# Patient Record
Sex: Female | Born: 1965 | Race: Black or African American | Hispanic: No | Marital: Married | Smoking: Never smoker
Health system: Southern US, Community
[De-identification: ages and names within clinical notes are randomized; demographics above are authoritative.]

## PROBLEM LIST (undated history)

## (undated) DIAGNOSIS — R519 Headache, unspecified: Secondary | ICD-10-CM

## (undated) DIAGNOSIS — M1990 Unspecified osteoarthritis, unspecified site: Secondary | ICD-10-CM

## (undated) DIAGNOSIS — R7303 Prediabetes: Secondary | ICD-10-CM

## (undated) DIAGNOSIS — F419 Anxiety disorder, unspecified: Secondary | ICD-10-CM

## (undated) DIAGNOSIS — J189 Pneumonia, unspecified organism: Secondary | ICD-10-CM

## (undated) DIAGNOSIS — E669 Obesity, unspecified: Secondary | ICD-10-CM

## (undated) DIAGNOSIS — E039 Hypothyroidism, unspecified: Secondary | ICD-10-CM

## (undated) DIAGNOSIS — G4730 Sleep apnea, unspecified: Secondary | ICD-10-CM

## (undated) DIAGNOSIS — K219 Gastro-esophageal reflux disease without esophagitis: Secondary | ICD-10-CM

## (undated) HISTORY — PX: COSMETIC SURGERY: SHX468

## (undated) HISTORY — PX: COLONOSCOPY: SHX174

## (undated) HISTORY — PX: BUNIONECTOMY: SHX129

## (undated) HISTORY — PX: BARIATRIC SURGERY: SHX1103

## (undated) HISTORY — DX: Obesity, unspecified: E66.9

---

## 2003-11-29 HISTORY — PX: BREAST SURGERY: SHX581

## 2007-11-29 DIAGNOSIS — C801 Malignant (primary) neoplasm, unspecified: Secondary | ICD-10-CM

## 2007-11-29 HISTORY — PX: TOTAL THYROIDECTOMY: SHX2547

## 2007-11-29 HISTORY — DX: Malignant (primary) neoplasm, unspecified: C80.1

## 2014-11-28 DIAGNOSIS — J45909 Unspecified asthma, uncomplicated: Secondary | ICD-10-CM

## 2014-11-28 HISTORY — DX: Unspecified asthma, uncomplicated: J45.909

## 2019-12-02 ENCOUNTER — Emergency Department (HOSPITAL_COMMUNITY)
Admission: EM | Admit: 2019-12-02 | Discharge: 2019-12-02 | Discharge status: Absent without leave | Payer: Federal, State, Local not specified - PPO

## 2019-12-03 DIAGNOSIS — Z20828 Contact with and (suspected) exposure to other viral communicable diseases: Secondary | ICD-10-CM | POA: Diagnosis not present

## 2019-12-16 DIAGNOSIS — Z20822 Contact with and (suspected) exposure to covid-19: Secondary | ICD-10-CM | POA: Diagnosis not present

## 2019-12-16 DIAGNOSIS — Z20828 Contact with and (suspected) exposure to other viral communicable diseases: Secondary | ICD-10-CM | POA: Diagnosis not present

## 2019-12-19 DIAGNOSIS — Z6841 Body Mass Index (BMI) 40.0 and over, adult: Secondary | ICD-10-CM | POA: Diagnosis not present

## 2019-12-19 DIAGNOSIS — E890 Postprocedural hypothyroidism: Secondary | ICD-10-CM | POA: Diagnosis not present

## 2019-12-19 DIAGNOSIS — M1990 Unspecified osteoarthritis, unspecified site: Secondary | ICD-10-CM | POA: Diagnosis not present

## 2019-12-19 DIAGNOSIS — Z85850 Personal history of malignant neoplasm of thyroid: Secondary | ICD-10-CM | POA: Diagnosis not present

## 2019-12-20 DIAGNOSIS — E890 Postprocedural hypothyroidism: Secondary | ICD-10-CM | POA: Diagnosis not present

## 2019-12-20 DIAGNOSIS — E781 Pure hyperglyceridemia: Secondary | ICD-10-CM | POA: Diagnosis not present

## 2019-12-20 DIAGNOSIS — Z0000 Encounter for general adult medical examination without abnormal findings: Secondary | ICD-10-CM | POA: Diagnosis not present

## 2019-12-25 DIAGNOSIS — R7309 Other abnormal glucose: Secondary | ICD-10-CM | POA: Diagnosis not present

## 2019-12-25 DIAGNOSIS — Z0000 Encounter for general adult medical examination without abnormal findings: Secondary | ICD-10-CM | POA: Diagnosis not present

## 2019-12-25 DIAGNOSIS — E781 Pure hyperglyceridemia: Secondary | ICD-10-CM | POA: Diagnosis not present

## 2019-12-31 DIAGNOSIS — R7301 Impaired fasting glucose: Secondary | ICD-10-CM | POA: Diagnosis not present

## 2019-12-31 DIAGNOSIS — Z85850 Personal history of malignant neoplasm of thyroid: Secondary | ICD-10-CM | POA: Diagnosis not present

## 2019-12-31 DIAGNOSIS — E781 Pure hyperglyceridemia: Secondary | ICD-10-CM | POA: Diagnosis not present

## 2019-12-31 DIAGNOSIS — E890 Postprocedural hypothyroidism: Secondary | ICD-10-CM | POA: Diagnosis not present

## 2020-01-04 DIAGNOSIS — Z1231 Encounter for screening mammogram for malignant neoplasm of breast: Secondary | ICD-10-CM | POA: Diagnosis not present

## 2020-02-05 DIAGNOSIS — E781 Pure hyperglyceridemia: Secondary | ICD-10-CM | POA: Diagnosis not present

## 2020-02-11 DIAGNOSIS — Z0000 Encounter for general adult medical examination without abnormal findings: Secondary | ICD-10-CM | POA: Diagnosis not present

## 2020-02-11 DIAGNOSIS — M1611 Unilateral primary osteoarthritis, right hip: Secondary | ICD-10-CM | POA: Diagnosis not present

## 2020-02-11 DIAGNOSIS — M25551 Pain in right hip: Secondary | ICD-10-CM | POA: Diagnosis not present

## 2020-02-11 DIAGNOSIS — E781 Pure hyperglyceridemia: Secondary | ICD-10-CM | POA: Diagnosis not present

## 2020-02-13 DIAGNOSIS — R7309 Other abnormal glucose: Secondary | ICD-10-CM | POA: Diagnosis not present

## 2020-02-13 DIAGNOSIS — R7301 Impaired fasting glucose: Secondary | ICD-10-CM | POA: Diagnosis not present

## 2020-03-20 DIAGNOSIS — M25552 Pain in left hip: Secondary | ICD-10-CM | POA: Diagnosis not present

## 2020-03-20 DIAGNOSIS — M1611 Unilateral primary osteoarthritis, right hip: Secondary | ICD-10-CM | POA: Diagnosis not present

## 2020-03-26 DIAGNOSIS — Z01818 Encounter for other preprocedural examination: Secondary | ICD-10-CM | POA: Diagnosis not present

## 2020-03-26 DIAGNOSIS — E890 Postprocedural hypothyroidism: Secondary | ICD-10-CM | POA: Diagnosis not present

## 2020-04-08 DIAGNOSIS — E890 Postprocedural hypothyroidism: Secondary | ICD-10-CM | POA: Diagnosis not present

## 2020-04-09 NOTE — Patient Instructions (Addendum)
DUE TO COVID-19 ONLY ONE VISITOR IS ALLOWED TO COME WITH YOU AND STAY IN THE WAITING ROOM ONLY DURING PRE OP AND PROCEDURE DAY OF SURGERY. THE 2 VISITORS  MAY VISIT WITH YOU AFTER SURGERY IN YOUR PRIVATE ROOM DURING VISITING HOURS ONLY!  YOU NEED TO HAVE A COVID 19 TEST ON_5/22______ @_10 :05______, THIS TEST MUST BE DONE BEFORE SURGERY, COME  [address], [place] , [address].  ([place]) ONCE YOUR COVID TEST IS COMPLETED, PLEASE BEGIN THE QUARANTINE INSTRUCTIONS AS OUTLINED IN YOUR HANDOUT.                Erika Lambert    Your procedure is scheduled on: 04/22/20   Report to [facility] Main  Entrance   Report to admitting at  8:20 AM     Call this number if you have problems the morning of surgery [place], NO CHEWING GUM [place].    Do not eat food After Midnight.   YOU MAY HAVE CLEAR LIQUIDS FROM MIDNIGHT UNTIL 7:30 AM.   At 7:30 AM Please finish the prescribed Pre-Surgery Gatorade drink  . Nothing by mouth after you finish the Gatorade drink !   Take these medicines the morning of surgery with A SIP OF WATER: Levothyroxine                                 You may not have any metal on your body including hair pins and              piercings  Do not wear jewelry, make-up, lotions, powders or perfumes, deodorant             Do not wear nail polish on your fingernails.  Do not shave  48 hours prior to surgery.        .   Do not bring valuables to the hospital. [place].  Contacts, dentures or bridgework may not be worn into surgery.       Patients discharged the day of surgery will not be allowed to drive home .  IF YOU ARE HAVING SURGERY AND GOING HOME THE SAME DAY, YOU MUST HAVE AN ADULT TO DRIVE YOU HOME AND BE WITH YOU FOR 24 HOURS.   YOU MAY GO HOME BY TAXI OR UBER OR ORTHERWISE, BUT AN ADULT MUST ACCOMPANY  YOU HOME AND STAY WITH YOU FOR 24 HOURS.  Name and phone number of your driver:  Special Instructions: N/A              Please read over the following fact sheets you were given: _____________________________________________________________________             [facility] - Preparing for Surgery  Before surgery, you can play an important role.   Because skin is not sterile, your skin needs to be as free of germs as possible .  You can reduce the number of germs on your skin by washing with CHG (chlorahexidine gluconate) soap before surgery.   CHG is an antiseptic cleaner which kills germs and bonds with the skin to continue killing germs even after washing. Please DO NOT use if you have an allergy to CHG or antibacterial soaps.   If  your skin becomes reddened/irritated stop using the CHG and inform your nurse when you arrive at Short Stay. Do not shave (including legs and underarms) for at least 48 hours prior to the first CHG shower.  Please follow these instructions carefully:  1.  Shower with CHG Soap the night before surgery and the  morning of Surgery.  2.  If you choose to wash your hair, wash your hair first as usual with your  normal  shampoo.  3.  After you shampoo, rinse your hair and body thoroughly to remove the  shampoo.                                        4.  Use CHG as you would any other liquid soap.  You can apply chg directly  to the skin and wash                       Gently with a scrungie or clean washcloth.  5.  Apply the CHG Soap to your body ONLY FROM THE NECK DOWN.   Do not use on face/ open                           Wound or open sores. Avoid contact with eyes, ears mouth and genitals (private parts).                       Wash face,  Genitals (private parts) with your normal soap.             6.  Wash thoroughly, paying special attention to the area where your surgery  will be performed.  7.  Thoroughly rinse your body with warm water from the neck  down.  8.  DO NOT shower/wash with your normal soap after using and rinsing off  the CHG Soap.             9.  Pat yourself dry with a clean towel.            10.  Wear clean pajamas.            11.  Place clean sheets on your bed the night of your first shower and do not  sleep with pets. Day of Surgery : Do not apply any lotions/deodorants the morning of surgery.  Please wear clean clothes to the hospital/surgery center.  FAILURE TO FOLLOW THESE INSTRUCTIONS MAY RESULT IN THE CANCELLATION OF YOUR SURGERY PATIENT SIGNATURE_________________________________  NURSE SIGNATURE__________________________________  ________________________________________________________________________   Erika Lambert  An incentive spirometer is a tool that can help keep your lungs clear and active. This tool measures how well you are filling your lungs with each breath. Taking long deep breaths may help reverse or decrease the chance of developing breathing (pulmonary) problems (especially infection) following:  A long period of time when you are unable to move or be active. BEFORE THE PROCEDURE   If the spirometer includes an indicator to show your best effort, your nurse or respiratory therapist will set it to a desired goal.  If possible, sit up straight or lean slightly forward. Try not to slouch.  Hold the incentive spirometer in an upright position. INSTRUCTIONS FOR USE  1. Sit on the edge of your bed if possible, or sit up as far as you can in  bed or on a chair. 2. Hold the incentive spirometer in an upright position. 3. Breathe out normally. 4. Place the mouthpiece in your mouth and seal your lips tightly around it. 5. Breathe in slowly and as deeply as possible, raising the piston or the ball toward the top of the column. 6. Hold your breath for 3-5 seconds or for as long as possible. Allow the piston or ball to fall to the bottom of the column. 7. Remove the mouthpiece from your mouth  and breathe out normally. 8. Rest for a few seconds and repeat Steps 1 through 7 at least 10 times every 1-2 hours when you are awake. Take your time and take a few normal breaths between deep breaths. 9. The spirometer may include an indicator to show your best effort. Use the indicator as a goal to work toward during each repetition. 10. After each set of 10 deep breaths, practice coughing to be sure your lungs are clear. If you have an incision (the cut made at the time of surgery), support your incision when coughing by placing a pillow or rolled up towels firmly against it. Once you are able to get out of bed, walk around indoors and cough well. You may stop using the incentive spirometer when instructed by your caregiver.  RISKS AND COMPLICATIONS  Take your time so you do not get dizzy or light-headed.  If you are in pain, you may need to take or ask for pain medication before doing incentive spirometry. It is harder to take a deep breath if you are having pain. AFTER USE  Rest and breathe slowly and easily.  It can be helpful to keep track of a log of your progress. Your caregiver can provide you with a simple table to help with this. If you are using the spirometer at home, follow these instructions: [place] IF:   You are having difficultly using the spirometer.  You have trouble using the spirometer as often as instructed.  Your pain medication is not giving enough relief while using the spirometer.  You develop fever of 100.5 F (38.1 C) or higher. SEEK IMMEDIATE MEDICAL CARE IF:   You cough up bloody sputum that had not been present before.  You develop fever of 102 F (38.9 C) or greater.  You develop worsening pain at or near the incision site. MAKE SURE YOU:   Understand these instructions.  Will watch your condition.  Will get help right away if you are not doing well or get worse. Document Released: 03/27/2007 Document Revised: 02/06/2012 Document  Reviewed: 05/28/2007 [facility] Patient Information 2014 [place], [place].   ________________________________________________________________________

## 2020-04-10 ENCOUNTER — Encounter (HOSPITAL_COMMUNITY): Payer: Self-pay | Admitting: *Deleted

## 2020-04-10 ENCOUNTER — Other Ambulatory Visit: Payer: Self-pay

## 2020-04-10 ENCOUNTER — Encounter (HOSPITAL_COMMUNITY)
Admission: RE | Admit: 2020-04-10 | Discharge: 2020-04-10 | Disposition: A | Discharge status: Home / Self Care | Payer: Federal, State, Local not specified - PPO | Source: Ambulatory Visit | Attending: Orthopedic Surgery | Admitting: Orthopedic Surgery

## 2020-04-10 DIAGNOSIS — Z01812 Encounter for preprocedural laboratory examination: Secondary | ICD-10-CM | POA: Insufficient documentation

## 2020-04-10 HISTORY — DX: Prediabetes: R73.03

## 2020-04-10 NOTE — Progress Notes (Signed)
PCP - Dr. Quinn Axe Cardiologist - no  Chest x-ray - no EKG - no Stress Test - no ECHO - no Cardiac Cath - no  Sleep Study - yes CPAP - no. Pt had wt loss and apnea resolved  Fasting Blood Sugar - NA Checks Blood Sugar _____ times a day  Blood Thinner Instructions: Aspirin Instructions:NA Last Dose:  Anesthesia review:   Patient denies shortness of breath, fever, cough and chest pain at PAT appointment yes  Patient verbalized understanding of instructions that were given to them at the PAT appointment. Patient was also instructed that they will need to review over the PAT instructions again at home before surgery. yes

## 2020-04-14 ENCOUNTER — Other Ambulatory Visit: Payer: Self-pay

## 2020-04-14 ENCOUNTER — Encounter (HOSPITAL_COMMUNITY)
Admission: RE | Admit: 2020-04-14 | Discharge: 2020-04-14 | Disposition: A | Discharge status: Home / Self Care | Payer: Federal, State, Local not specified - PPO | Source: Ambulatory Visit | Attending: Orthopedic Surgery | Admitting: Orthopedic Surgery

## 2020-04-14 DIAGNOSIS — Z79899 Other long term (current) drug therapy: Secondary | ICD-10-CM | POA: Diagnosis not present

## 2020-04-14 DIAGNOSIS — Z01812 Encounter for preprocedural laboratory examination: Secondary | ICD-10-CM | POA: Insufficient documentation

## 2020-04-14 DIAGNOSIS — M1711 Unilateral primary osteoarthritis, right knee: Secondary | ICD-10-CM | POA: Insufficient documentation

## 2020-04-14 DIAGNOSIS — Z85850 Personal history of malignant neoplasm of thyroid: Secondary | ICD-10-CM | POA: Insufficient documentation

## 2020-04-14 DIAGNOSIS — Z79890 Hormone replacement therapy (postmenopausal): Secondary | ICD-10-CM | POA: Diagnosis not present

## 2020-04-14 LAB — COMPREHENSIVE METABOLIC PANEL
ALT: 12 U/L (ref 0–44)
AST: 22 U/L (ref 15–41)
Albumin: 4.1 g/dL (ref 3.5–5.0)
Alkaline Phosphatase: 69 U/L (ref 38–126)
Anion gap: 6 (ref 5–15)
BUN: 14 mg/dL (ref 6–20)
CO2: 28 mmol/L (ref 22–32)
Calcium: 9.1 mg/dL (ref 8.9–10.3)
Chloride: 104 mmol/L (ref 98–111)
Creatinine, Ser: 0.68 mg/dL (ref 0.44–1.00)
GFR calc Af Amer: >60 mL/min (ref >60)
GFR calc non Af Amer: >60 mL/min (ref >60)
Glucose, Bld: 98 mg/dL (ref 70–99)
Potassium: 4.3 mmol/L (ref 3.5–5.1)
Sodium: 138 mmol/L (ref 135–145)
Total Bilirubin: 0.6 mg/dL (ref 0.3–1.2)
Total Protein: 7.1 g/dL (ref 6.5–8.1)

## 2020-04-14 LAB — PROTIME-INR
INR: 1.2 (ref 0.8–1.2)
Prothrombin Time: 14.7 seconds (ref 11.4–15.2)

## 2020-04-14 LAB — CBC
HCT: 43 % (ref 36.0–46.0)
Hemoglobin: 13.8 g/dL (ref 12.0–15.0)
MCH: 30.9 pg (ref 26.0–34.0)
MCHC: 32.1 g/dL (ref 30.0–36.0)
MCV: 96.2 fL (ref 80.0–100.0)
Platelets: 188 10*3/uL (ref 150–400)
RBC: 4.47 MIL/uL (ref 3.87–5.11)
RDW: 13.7 % (ref 11.5–15.5)
WBC: 6.2 10*3/uL (ref 4.0–10.5)
nRBC: 0 % (ref 0.0–0.2)

## 2020-04-14 LAB — APTT: aPTT: 28 seconds (ref 24–36)

## 2020-04-15 LAB — HEMOGLOBIN A1C
Hgb A1c MFr Bld: 5.8 % — ABNORMAL HIGH (ref 4.8–5.6)
Mean Plasma Glucose: 120 mg/dL

## 2020-04-15 LAB — ABO/RH: ABO/RH(D): B POS

## 2020-04-15 NOTE — H&P (Signed)
TOTAL HIP ADMISSION H&P  Patient is admitted for right total hip arthroplasty.  Subjective:  Chief Complaint: Right hip pain  HPI: Erika Lambert, 54 y.o. female, has a history of pain and functional disability in the right hip due to arthritis and patient has failed non-surgical conservative treatments for greater than 12 weeks to include NSAID's and/or analgesics and activity modification. Onset of symptoms was gradual, starting several years ago with gradually worsening course since that time. The patient noted no past surgery on the right hip. Patient currently rates pain in the right hip at 8 out of 10 with activity. Patient has worsening of pain with activity and weight bearing, pain that interfers with activities of daily living, crepitus and instability. Patient has evidence of end-stage osteoarthritic change in the right hip, severe bone-on-bone with osteophyte formation. 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 This condition presents safety issues increasing the risk of falls. There is no current active infection.  There are no problems to display for this patient.   Past Medical History:  Diagnosis Date  . Asthma 2016  . Cancer [facility]) 2009   thyroid  . Pre-diabetes     Past Surgical History:  Procedure Laterality Date  . BARIATRIC SURGERY    . BREAST SURGERY Bilateral 2005   lift and skin removed  . BUNIONECTOMY Left   . CESAREAN SECTION  1990  . TOTAL THYROIDECTOMY  2009    Prior to Admission medications   Medication Sig Start Date End Date Taking? Authorizing Provider  acetaminophen (TYLENOL) 500 MG tablet Take 500-1,000 mg by mouth every 6 (six) hours as needed (pain/headaches.).   Yes [provider]  Biotin w/ Vitamins C & E (HAIR/SKIN/NAILS PO) Take 2 tablets by mouth daily with lunch.   Yes [provider]  levothyroxine (SYNTHROID) 125 MCG tablet Take 125 mcg by mouth daily before breakfast. 02/17/20  Yes  [provider]  Multiple Vitamin (MULTIVITAMIN WITH MINERALS) TABS tablet Take 1 tablet by mouth daily at 6 PM. Centrum Silver Women's 50+   Yes [provider]    Allergies  Allergen Reactions  . Albuterol Hives  . Latex Other (See Comments)    welts  . Penicillins Hives and Other (See Comments)    dizziness    Social History   Socioeconomic History  . Marital status: Married    Spouse name: Not on file  . Number of children: Not on file  . Years of education: Not on file  . Highest education level: Not on file  Occupational History  . Not on file  Tobacco Use  . Smoking status: Never Smoker  . Smokeless tobacco: Never Used  Substance and Sexual Activity  . Alcohol use: Never  . Drug use: Never  . Sexual activity: Not on file  Other Topics Concern  . Not on file  Social History Narrative  . Not on file   Social Determinants of Health   Financial Resource Strain:   . Difficulty of Paying Living Expenses:   Food Insecurity:   . Worried About Charity fundraiser in the Last Year:   . Arboriculturist in the Last Year:   Transportation Needs:   . Film/video editor (Medical):   Marland Kitchen Lack of Transportation (Non-Medical):   Physical Activity:   . Days of Exercise per Week:   . Minutes of Exercise per Session:   Stress:   . Feeling of Stress :  Social Connections:   . Frequency of Communication with Friends and Family:   . Frequency of Social Gatherings with Friends and Family:   . Attends Religious Services:   . Active Member of Clubs or Organizations:   . Attends Archivist Meetings:   Marland Kitchen Marital Status:   Intimate Partner Violence:   . Fear of Current or Ex-Partner:   . Emotionally Abused:   Marland Kitchen Physically Abused:   . Sexually Abused:       Tobacco Use: Low Risk   . Smoking Tobacco Use: Never Smoker  . Smokeless Tobacco Use: Never Used   Social History   Substance and Sexual Activity  Alcohol Use Never    No family  history on file.  Review of Systems  Constitutional: Negative for chills and fever.  HENT: Negative for congestion, sore throat and tinnitus.   Eyes: Negative for double vision, photophobia and pain.  Respiratory: Negative for cough, shortness of breath and wheezing.   Cardiovascular: Negative for chest pain, palpitations and orthopnea.  Gastrointestinal: Negative for heartburn, nausea and vomiting.  Genitourinary: Negative for dysuria, frequency and urgency.  Musculoskeletal: Positive for joint pain.  Neurological: Negative for dizziness, weakness and headaches.     Objective:  Physical Exam: Well nourished and well developed.  General: Alert and oriented x3, cooperative and pleasant, no acute distress.  Head: normocephalic, atraumatic, neck supple.  Eyes: EOMI.  Respiratory: breath sounds clear in all fields, no wheezing, rales, or rhonchi. Cardiovascular: Regular rate and rhythm, no murmurs, gallops or rubs.  Abdomen: non-tender to palpation and soft, normoactive bowel sounds. Musculoskeletal:  Right Hip Exam: The range of motion: Flexion to 90 degrees. No internal rotation. External rotation to 10 to 20 degrees. Abduction to 20 degrees. There is no tenderness over the greater trochanteric bursa.   Calves soft and nontender. Motor function intact in LE. Strength 5/5 LE bilaterally. Neuro: Distal pulses 2+. Sensation to light touch intact in LE.  Vital signs in last 24 hours: Temp:  [98.6 F (37 C)] 98.6 F (37 C) (05/18 1634) Pulse Rate:  [63] 63 (05/18 1634) Resp:  [18] 18 (05/18 1634) BP: (149)/(89) 149/89 (05/18 1634) SpO2:  [99 %] 99 % (05/18 1634) Weight:  [110.8 kg] 110.8 kg (05/18 1634)  Imaging Review Plain radiographs demonstrate severe degenerative joint disease of the right hip. The bone quality appears to be adequate for age and reported activity level.  Assessment/Plan:  End stage arthritis, right hip  The patient history, physical examination,  clinical judgement of the provider and imaging studies are consistent with end stage degenerative joint disease of the right hip and total hip arthroplasty is deemed medically necessary. The treatment options including medical management, injection therapy, arthroscopy and arthroplasty were discussed at length. The risks and benefits of total hip arthroplasty were presented and reviewed. The risks due to aseptic loosening, infection, stiffness, dislocation/subluxation, thromboembolic complications and other imponderables were discussed. The patient acknowledged the explanation, agreed to proceed with the plan and consent was signed. Patient is being admitted for inpatient treatment for surgery, pain control, PT, OT, prophylactic antibiotics, VTE prophylaxis, progressive ambulation and ADLs and discharge planning.The patient is planning to be discharged home.   Patient's anticipated LOS is less than 2 midnights, meeting these requirements: - Younger than 49 - Lives within 1 hour of care - Has a competent adult at home to recover with post-op recover - NO history of  - Chronic pain requiring opiods  - Diabetes  - Coronary  Artery Disease  - Heart failure  - Heart attack  - Stroke  - DVT/VTE  - Cardiac arrhythmia  - Respiratory Failure/COPD  - Renal failure  - Anemia  - Advanced Liver disease  Therapy Plans: HEP Disposition: Home with mom Planned DVT Prophylaxis: Xarelto 10 mg QD (hx of thyroid CA > 10 years ago) DME Needed: Gilford Rile, 3-in-1 PCP: Sela Hilding, MD (appt on 05/12) TXA: IV Allergies: Latex (swelling), PCN (rash, dizziness) Anesthesia Concerns: None BMI: 39.7  Other:  - SDD - Cannot take NSAIDs due to gastric irritation  - Patient was instructed on what medications to stop prior to surgery. - Follow-up visit in 2 weeks with Dr. Wynelle Link - Begin physical therapy following surgery - Pre-operative lab work as pre-surgical testing - Prescriptions will be provided in  hospital at time of discharge  Theresa Duty, PA-C Orthopedic Surgery [facility] [place]

## 2020-04-18 ENCOUNTER — Other Ambulatory Visit (HOSPITAL_COMMUNITY)
Admission: RE | Admit: 2020-04-18 | Discharge: 2020-04-18 | Disposition: A | Discharge status: Home / Self Care | Payer: Federal, State, Local not specified - PPO | Source: Ambulatory Visit | Attending: Orthopedic Surgery | Admitting: Orthopedic Surgery

## 2020-04-18 DIAGNOSIS — Z01812 Encounter for preprocedural laboratory examination: Secondary | ICD-10-CM | POA: Insufficient documentation

## 2020-04-18 DIAGNOSIS — Z20822 Contact with and (suspected) exposure to covid-19: Secondary | ICD-10-CM | POA: Diagnosis not present

## 2020-04-18 LAB — SARS CORONAVIRUS 2 (TAT 6-24 HRS): SARS Coronavirus 2: NEGATIVE

## 2020-04-21 NOTE — Progress Notes (Signed)
Pt informed of new time to arrive at the hospital.

## 2020-04-21 NOTE — Anesthesia Preprocedure Evaluation (Addendum)
Anesthesia Evaluation  Patient identified by MRN, date of birth, ID band Patient awake    Reviewed: Allergy & Precautions, NPO status , Patient's Chart, lab work & pertinent test results  Airway Mallampati: II  TM Distance: >3 FB Neck ROM: Full    Dental  (+) Teeth Intact, Dental Advisory Given, Chipped,    Pulmonary asthma ,    Pulmonary exam normal breath sounds clear to auscultation       Cardiovascular negative cardio ROS Normal cardiovascular exam Rhythm:Regular Rate:Normal     Neuro/Psych negative neurological ROS     GI/Hepatic negative GI ROS, Neg liver ROS,   Endo/Other  Hypothyroidism Morbid obesity  Renal/GU negative Renal ROS     Musculoskeletal  (+) Arthritis ,   Abdominal   Peds  Hematology negative hematology ROS (+) Plt 188k   Anesthesia Other Findings   Reproductive/Obstetrics                            Anesthesia Physical Anesthesia Plan  ASA: III  Anesthesia Plan: Spinal   Post-op Pain Management:    Induction: Intravenous  PONV Risk Score and Plan: 2 and Propofol infusion and Treatment may vary due to age or medical condition  Airway Management Planned: Natural Airway and Nasal Cannula  Additional Equipment:   Intra-op Plan:   Post-operative Plan:   Informed Consent: I have reviewed the patients History and Physical, chart, labs and discussed the procedure including the risks, benefits and alternatives for the proposed anesthesia with the patient or authorized representative who has indicated his/her understanding and acceptance.     Dental advisory given  Plan Discussed with: CRNA  Anesthesia Plan Comments:        Anesthesia Quick Evaluation

## 2020-04-22 ENCOUNTER — Ambulatory Visit (HOSPITAL_COMMUNITY): Payer: Federal, State, Local not specified - PPO | Admitting: Anesthesiology

## 2020-04-22 ENCOUNTER — Other Ambulatory Visit: Payer: Self-pay

## 2020-04-22 ENCOUNTER — Ambulatory Visit (HOSPITAL_COMMUNITY): Payer: Federal, State, Local not specified - PPO

## 2020-04-22 ENCOUNTER — Observation Stay (HOSPITAL_COMMUNITY)
Admission: RE | Admit: 2020-04-22 | Discharge: 2020-04-23 | Disposition: A | Discharge status: Home / Self Care | Payer: Federal, State, Local not specified - PPO | Attending: Orthopedic Surgery | Admitting: Orthopedic Surgery

## 2020-04-22 ENCOUNTER — Encounter (HOSPITAL_COMMUNITY): Payer: Self-pay | Admitting: Orthopedic Surgery

## 2020-04-22 ENCOUNTER — Encounter (HOSPITAL_COMMUNITY)
Admission: RE | Disposition: A | Discharge status: Home / Self Care | Payer: Self-pay | Source: Home / Self Care | Attending: Orthopedic Surgery

## 2020-04-22 ENCOUNTER — Telehealth (HOSPITAL_COMMUNITY): Payer: Self-pay | Admitting: *Deleted

## 2020-04-22 DIAGNOSIS — Z9884 Bariatric surgery status: Secondary | ICD-10-CM | POA: Diagnosis not present

## 2020-04-22 DIAGNOSIS — M1611 Unilateral primary osteoarthritis, right hip: Secondary | ICD-10-CM | POA: Diagnosis not present

## 2020-04-22 DIAGNOSIS — Z85850 Personal history of malignant neoplasm of thyroid: Secondary | ICD-10-CM | POA: Diagnosis not present

## 2020-04-22 DIAGNOSIS — M169 Osteoarthritis of hip, unspecified: Secondary | ICD-10-CM | POA: Diagnosis present

## 2020-04-22 DIAGNOSIS — J45909 Unspecified asthma, uncomplicated: Secondary | ICD-10-CM | POA: Insufficient documentation

## 2020-04-22 DIAGNOSIS — R7303 Prediabetes: Secondary | ICD-10-CM | POA: Diagnosis not present

## 2020-04-22 DIAGNOSIS — Z96649 Presence of unspecified artificial hip joint: Secondary | ICD-10-CM | POA: Diagnosis not present

## 2020-04-22 DIAGNOSIS — Z96641 Presence of right artificial hip joint: Secondary | ICD-10-CM

## 2020-04-22 DIAGNOSIS — Z6841 Body Mass Index (BMI) 40.0 and over, adult: Secondary | ICD-10-CM | POA: Diagnosis not present

## 2020-04-22 DIAGNOSIS — E039 Hypothyroidism, unspecified: Secondary | ICD-10-CM | POA: Diagnosis not present

## 2020-04-22 DIAGNOSIS — Z7902 Long term (current) use of antithrombotics/antiplatelets: Secondary | ICD-10-CM | POA: Insufficient documentation

## 2020-04-22 DIAGNOSIS — Z79890 Hormone replacement therapy (postmenopausal): Secondary | ICD-10-CM | POA: Diagnosis not present

## 2020-04-22 DIAGNOSIS — Z79899 Other long term (current) drug therapy: Secondary | ICD-10-CM | POA: Insufficient documentation

## 2020-04-22 DIAGNOSIS — Z419 Encounter for procedure for purposes other than remedying health state, unspecified: Secondary | ICD-10-CM

## 2020-04-22 DIAGNOSIS — Z471 Aftercare following joint replacement surgery: Secondary | ICD-10-CM | POA: Diagnosis not present

## 2020-04-22 HISTORY — PX: TOTAL HIP ARTHROPLASTY: SHX124

## 2020-04-22 LAB — TYPE AND SCREEN
ABO/RH(D): B POS
Antibody Screen: NEGATIVE

## 2020-04-22 SURGERY — ARTHROPLASTY, HIP, TOTAL, ANTERIOR APPROACH
Anesthesia: Spinal | Site: Hip | Laterality: Right

## 2020-04-22 MED ORDER — METHOCARBAMOL 500 MG PO TABS
500.0000 mg | ORAL_TABLET | Freq: Four times a day (QID) | ORAL | Status: DC | PRN
  Administered 2020-04-23 (×2): 500 mg via ORAL
  Filled 2020-04-22 (×2): qty 1

## 2020-04-22 MED ORDER — EPHEDRINE 5 MG/ML INJ
INTRAVENOUS | Status: AC
  Filled 2020-04-22: qty 10

## 2020-04-22 MED ORDER — ACETAMINOPHEN 325 MG PO TABS: 325.0000 mg | ORAL_TABLET | Freq: Four times a day (QID) | ORAL | Status: DC | PRN

## 2020-04-22 MED ORDER — MEPIVACAINE HCL (PF) 2 % IJ SOLN
INTRAMUSCULAR | Status: DC | PRN
Start: 2020-04-22 — End: 2020-04-22
  Administered 2020-04-22: 3 mL via INTRATHECAL

## 2020-04-22 MED ORDER — METHOCARBAMOL 500 MG IVPB - SIMPLE MED
INTRAVENOUS | Status: AC
  Filled 2020-04-22: qty 50

## 2020-04-22 MED ORDER — EPHEDRINE SULFATE-NACL 50-0.9 MG/10ML-% IV SOSY
PREFILLED_SYRINGE | INTRAVENOUS | Status: DC | PRN
  Administered 2020-04-22: 10 mg via INTRAVENOUS
  Administered 2020-04-22 (×3): 5 mg via INTRAVENOUS
  Administered 2020-04-22: 10 mg via INTRAVENOUS

## 2020-04-22 MED ORDER — LACTATED RINGERS IV BOLUS
250.0000 mL | Freq: Once | INTRAVENOUS | Status: AC
  Administered 2020-04-22: 250 mL via INTRAVENOUS

## 2020-04-22 MED ORDER — DEXAMETHASONE SODIUM PHOSPHATE 10 MG/ML IJ SOLN
8.0000 mg | Freq: Once | INTRAMUSCULAR | Status: AC
  Administered 2020-04-22: 8 mg via INTRAVENOUS

## 2020-04-22 MED ORDER — FENTANYL CITRATE (PF) 100 MCG/2ML IJ SOLN
INTRAMUSCULAR | Status: DC | PRN
  Administered 2020-04-22: 50 ug via INTRAVENOUS
  Administered 2020-04-22 (×2): 25 ug via INTRAVENOUS

## 2020-04-22 MED ORDER — DOCUSATE SODIUM 100 MG PO CAPS
100.0000 mg | ORAL_CAPSULE | Freq: Two times a day (BID) | ORAL | Status: DC
  Administered 2020-04-22 – 2020-04-23 (×2): 100 mg via ORAL
  Filled 2020-04-22 (×2): qty 1

## 2020-04-22 MED ORDER — BUPIVACAINE HCL 0.25 % IJ SOLN
INTRAMUSCULAR | Status: AC
  Filled 2020-04-22: qty 1

## 2020-04-22 MED ORDER — PROPOFOL 500 MG/50ML IV EMUL
INTRAVENOUS | Status: DC | PRN
  Administered 2020-04-22: 75 ug/kg/min via INTRAVENOUS

## 2020-04-22 MED ORDER — MORPHINE SULFATE (PF) 4 MG/ML IV SOLN: 0.5000 mg | INTRAVENOUS | Status: DC | PRN

## 2020-04-22 MED ORDER — ACETAMINOPHEN 10 MG/ML IV SOLN
1000.0000 mg | Freq: Once | INTRAVENOUS | Status: AC
  Administered 2020-04-22: 1000 mg via INTRAVENOUS
  Filled 2020-04-22: qty 100

## 2020-04-22 MED ORDER — TRAMADOL HCL 50 MG PO TABS: 50.0000 mg | ORAL_TABLET | Freq: Four times a day (QID) | ORAL | Status: DC | PRN

## 2020-04-22 MED ORDER — PROPOFOL 1000 MG/100ML IV EMUL
INTRAVENOUS | Status: AC
  Filled 2020-04-22: qty 100

## 2020-04-22 MED ORDER — LIDOCAINE 2% (20 MG/ML) 5 ML SYRINGE
INTRAMUSCULAR | Status: DC | PRN
  Administered 2020-04-22: 40 mg via INTRAVENOUS

## 2020-04-22 MED ORDER — MAGNESIUM CITRATE PO SOLN: 1.0000 | Freq: Once | ORAL | Status: DC | PRN

## 2020-04-22 MED ORDER — FENTANYL CITRATE (PF) 100 MCG/2ML IJ SOLN
INTRAMUSCULAR | Status: AC
  Administered 2020-04-22: 50 ug via INTRAVENOUS
  Filled 2020-04-22: qty 2

## 2020-04-22 MED ORDER — PHENYLEPHRINE HCL (PRESSORS) 10 MG/ML IV SOLN
INTRAVENOUS | Status: AC
  Filled 2020-04-22: qty 1

## 2020-04-22 MED ORDER — CHLORHEXIDINE GLUCONATE 0.12 % MT SOLN
15.0000 mL | Freq: Once | OROMUCOSAL | Status: AC
  Administered 2020-04-22: 15 mL via OROMUCOSAL

## 2020-04-22 MED ORDER — PROPOFOL 10 MG/ML IV BOLUS
INTRAVENOUS | Status: DC | PRN
  Administered 2020-04-22 (×2): 10 mg via INTRAVENOUS
  Administered 2020-04-22: 20 mg via INTRAVENOUS

## 2020-04-22 MED ORDER — ONDANSETRON HCL 4 MG/2ML IJ SOLN
INTRAMUSCULAR | Status: DC | PRN
  Administered 2020-04-22: 4 mg via INTRAVENOUS

## 2020-04-22 MED ORDER — RIVAROXABAN 10 MG PO TABS
10.0000 mg | ORAL_TABLET | Freq: Every day | ORAL | Status: DC
  Administered 2020-04-23: 10 mg via ORAL
  Filled 2020-04-22: qty 1

## 2020-04-22 MED ORDER — PROMETHAZINE HCL 25 MG/ML IJ SOLN: 6.2500 mg | INTRAMUSCULAR | Status: DC | PRN

## 2020-04-22 MED ORDER — OXYCODONE-ACETAMINOPHEN 5-325 MG PO TABS
ORAL_TABLET | ORAL | Status: AC
  Filled 2020-04-22: qty 1

## 2020-04-22 MED ORDER — 0.9 % SODIUM CHLORIDE (POUR BTL) OPTIME
TOPICAL | Status: DC | PRN
  Administered 2020-04-22: 1000 mL

## 2020-04-22 MED ORDER — HYDROCODONE-ACETAMINOPHEN 5-325 MG PO TABS
1.0000 | ORAL_TABLET | ORAL | Status: DC | PRN
  Administered 2020-04-22 (×2): 2 via ORAL
  Administered 2020-04-22: 1 via ORAL
  Administered 2020-04-23 (×3): 2 via ORAL
  Filled 2020-04-22 (×5): qty 2

## 2020-04-22 MED ORDER — ONDANSETRON HCL 4 MG/2ML IJ SOLN: 4.0000 mg | Freq: Four times a day (QID) | INTRAMUSCULAR | Status: DC | PRN

## 2020-04-22 MED ORDER — PHENYLEPHRINE HCL-NACL 10-0.9 MG/250ML-% IV SOLN
INTRAVENOUS | Status: DC | PRN
Start: 2020-04-22 — End: 2020-04-22
  Administered 2020-04-22: 20 ug/min via INTRAVENOUS

## 2020-04-22 MED ORDER — DEXAMETHASONE SODIUM PHOSPHATE 10 MG/ML IJ SOLN
INTRAMUSCULAR | Status: AC
  Filled 2020-04-22: qty 1

## 2020-04-22 MED ORDER — POVIDONE-IODINE 10 % EX SWAB
2.0000 "application " | Freq: Once | CUTANEOUS | Status: AC
  Administered 2020-04-22: 2 via TOPICAL

## 2020-04-22 MED ORDER — LACTATED RINGERS IV BOLUS: 250.0000 mL | Freq: Once | INTRAVENOUS | Status: DC

## 2020-04-22 MED ORDER — MORPHINE SULFATE (PF) 4 MG/ML IV SOLN
INTRAVENOUS | Status: AC
  Administered 2020-04-22: 1 mg via INTRAVENOUS
  Filled 2020-04-22: qty 1

## 2020-04-22 MED ORDER — FENTANYL CITRATE (PF) 100 MCG/2ML IJ SOLN
INTRAMUSCULAR | Status: AC
  Filled 2020-04-22: qty 2

## 2020-04-22 MED ORDER — HYDROCODONE-ACETAMINOPHEN 5-325 MG PO TABS
ORAL_TABLET | ORAL | Status: AC
  Administered 2020-04-22: 1 via ORAL
  Filled 2020-04-22: qty 1

## 2020-04-22 MED ORDER — HYDROCODONE-ACETAMINOPHEN 5-325 MG PO TABS: 1.0000 | ORAL_TABLET | Freq: Four times a day (QID) | ORAL | 0 refills | Status: DC | PRN

## 2020-04-22 MED ORDER — ONDANSETRON HCL 4 MG/2ML IJ SOLN
INTRAMUSCULAR | Status: AC
  Filled 2020-04-22: qty 2

## 2020-04-22 MED ORDER — DEXAMETHASONE SODIUM PHOSPHATE 10 MG/ML IJ SOLN
10.0000 mg | Freq: Once | INTRAMUSCULAR | Status: AC
  Administered 2020-04-23: 10 mg via INTRAVENOUS
  Filled 2020-04-22: qty 1

## 2020-04-22 MED ORDER — PROPOFOL 10 MG/ML IV BOLUS
INTRAVENOUS | Status: AC
  Filled 2020-04-22: qty 20

## 2020-04-22 MED ORDER — TRANEXAMIC ACID-NACL 1000-0.7 MG/100ML-% IV SOLN
1000.0000 mg | INTRAVENOUS | Status: AC
  Administered 2020-04-22: 1000 mg via INTRAVENOUS
  Filled 2020-04-22: qty 100

## 2020-04-22 MED ORDER — KETOROLAC TROMETHAMINE 30 MG/ML IJ SOLN
INTRAMUSCULAR | Status: AC
  Administered 2020-04-22: 30 mg
  Filled 2020-04-22: qty 1

## 2020-04-22 MED ORDER — METHOCARBAMOL 500 MG PO TABS: 500.0000 mg | ORAL_TABLET | Freq: Four times a day (QID) | ORAL | 0 refills | Status: DC | PRN

## 2020-04-22 MED ORDER — BUPIVACAINE HCL 0.25 % IJ SOLN
INTRAMUSCULAR | Status: DC | PRN
  Administered 2020-04-22: 30 mL

## 2020-04-22 MED ORDER — PHENOL 1.4 % MT LIQD: 1.0000 | OROMUCOSAL | Status: DC | PRN

## 2020-04-22 MED ORDER — LACTATED RINGERS IV SOLN: INTRAVENOUS | Status: DC

## 2020-04-22 MED ORDER — KETOROLAC TROMETHAMINE 30 MG/ML IJ SOLN: 30.0000 mg | Freq: Once | INTRAMUSCULAR | Status: DC

## 2020-04-22 MED ORDER — POLYETHYLENE GLYCOL 3350 17 G PO PACK: 17.0000 g | PACK | Freq: Every day | ORAL | Status: DC | PRN

## 2020-04-22 MED ORDER — FENTANYL CITRATE (PF) 100 MCG/2ML IJ SOLN
25.0000 ug | INTRAMUSCULAR | Status: DC | PRN
  Administered 2020-04-22: 50 ug via INTRAVENOUS

## 2020-04-22 MED ORDER — BUPIVACAINE-EPINEPHRINE (PF) 0.25% -1:200000 IJ SOLN
INTRAMUSCULAR | Status: AC
  Filled 2020-04-22: qty 30

## 2020-04-22 MED ORDER — METHOCARBAMOL 500 MG IVPB - SIMPLE MED
500.0000 mg | Freq: Four times a day (QID) | INTRAVENOUS | Status: DC | PRN
  Administered 2020-04-22: 500 mg via INTRAVENOUS
  Filled 2020-04-22: qty 50

## 2020-04-22 MED ORDER — CEFAZOLIN SODIUM-DEXTROSE 2-4 GM/100ML-% IV SOLN
2.0000 g | INTRAVENOUS | Status: AC
  Administered 2020-04-22: 2 g via INTRAVENOUS
  Filled 2020-04-22: qty 100

## 2020-04-22 MED ORDER — MENTHOL 3 MG MT LOZG: 1.0000 | LOZENGE | OROMUCOSAL | Status: DC | PRN

## 2020-04-22 MED ORDER — MIDAZOLAM HCL 2 MG/2ML IJ SOLN
INTRAMUSCULAR | Status: AC
  Filled 2020-04-22: qty 2

## 2020-04-22 MED ORDER — LIDOCAINE 2% (20 MG/ML) 5 ML SYRINGE
INTRAMUSCULAR | Status: AC
  Filled 2020-04-22: qty 5

## 2020-04-22 MED ORDER — CEFAZOLIN SODIUM-DEXTROSE 2-4 GM/100ML-% IV SOLN
2.0000 g | Freq: Four times a day (QID) | INTRAVENOUS | Status: AC
  Administered 2020-04-22 (×2): 2 g via INTRAVENOUS
  Filled 2020-04-22: qty 100

## 2020-04-22 MED ORDER — METOCLOPRAMIDE HCL 5 MG PO TABS: 5.0000 mg | ORAL_TABLET | Freq: Three times a day (TID) | ORAL | Status: DC | PRN

## 2020-04-22 MED ORDER — METOCLOPRAMIDE HCL 5 MG/ML IJ SOLN: 5.0000 mg | Freq: Three times a day (TID) | INTRAMUSCULAR | Status: DC | PRN

## 2020-04-22 MED ORDER — TRAMADOL HCL 50 MG PO TABS: 50.0000 mg | ORAL_TABLET | Freq: Four times a day (QID) | ORAL | 0 refills | Status: DC | PRN

## 2020-04-22 MED ORDER — LEVOTHYROXINE SODIUM 125 MCG PO TABS
125.0000 ug | ORAL_TABLET | Freq: Every day | ORAL | Status: DC
  Administered 2020-04-23: 125 ug via ORAL
  Filled 2020-04-22: qty 1

## 2020-04-22 MED ORDER — LACTATED RINGERS IV BOLUS
500.0000 mL | Freq: Once | INTRAVENOUS | Status: AC
  Administered 2020-04-22: 500 mL via INTRAVENOUS

## 2020-04-22 MED ORDER — CEFAZOLIN SODIUM-DEXTROSE 2-4 GM/100ML-% IV SOLN
INTRAVENOUS | Status: AC
  Filled 2020-04-22: qty 100

## 2020-04-22 MED ORDER — WATER FOR IRRIGATION, STERILE IR SOLN
Status: DC | PRN
  Administered 2020-04-22: 2000 mL

## 2020-04-22 MED ORDER — BISACODYL 10 MG RE SUPP: 10.0000 mg | Freq: Every day | RECTAL | Status: DC | PRN

## 2020-04-22 MED ORDER — ONDANSETRON HCL 4 MG PO TABS: 4.0000 mg | ORAL_TABLET | Freq: Four times a day (QID) | ORAL | Status: DC | PRN

## 2020-04-22 MED ORDER — MIDAZOLAM HCL 2 MG/2ML IJ SOLN
INTRAMUSCULAR | Status: DC | PRN
  Administered 2020-04-22: 2 mg via INTRAVENOUS

## 2020-04-22 MED ORDER — SODIUM CHLORIDE 0.9 % IV SOLN: INTRAVENOUS | Status: DC

## 2020-04-22 MED ORDER — HYDROCODONE-ACETAMINOPHEN 5-325 MG PO TABS
ORAL_TABLET | ORAL | Status: AC
  Filled 2020-04-22: qty 1

## 2020-04-22 MED ORDER — RIVAROXABAN 10 MG PO TABS
10.0000 mg | ORAL_TABLET | Freq: Every day | ORAL | 0 refills | Status: DC
Start: 2020-04-22 — End: 2021-01-05

## 2020-04-22 SURGICAL SUPPLY — 46 items
BAG DECANTER FOR FLEXI CONT (MISCELLANEOUS) IMPLANT
BAG ZIPLOCK 12X15 (MISCELLANEOUS) IMPLANT
BLADE SAG 18X100X1.27 (BLADE) ×3 IMPLANT
CLOSURE WOUND 1/2 X4 (GAUZE/BANDAGES/DRESSINGS) ×2
COVER PERINEAL POST (MISCELLANEOUS) ×3 IMPLANT
COVER SURGICAL LIGHT HANDLE (MISCELLANEOUS) ×3 IMPLANT
COVER WAND RF STERILE (DRAPES) IMPLANT
CUP ACET PINNACLE SECTR 50MM (Hips) ×1 IMPLANT
DECANTER SPIKE VIAL GLASS SM (MISCELLANEOUS) ×3 IMPLANT
DRAPE STERI IOBAN 125X83 (DRAPES) ×3 IMPLANT
DRAPE U-SHAPE 47X51 STRL (DRAPES) ×6 IMPLANT
DRSG ADAPTIC 3X8 NADH LF (GAUZE/BANDAGES/DRESSINGS) ×3 IMPLANT
DRSG AQUACEL AG ADV 3.5X10 (GAUZE/BANDAGES/DRESSINGS) ×3 IMPLANT
DURAPREP 26ML APPLICATOR (WOUND CARE) ×3 IMPLANT
ELECT REM PT RETURN 15FT ADLT (MISCELLANEOUS) ×3 IMPLANT
EVACUATOR 1/8 PVC DRAIN (DRAIN) IMPLANT
GLOVE BIO SURGEON STRL SZ 6 (GLOVE) IMPLANT
GLOVE BIO SURGEON STRL SZ7 (GLOVE) IMPLANT
GLOVE BIO SURGEON STRL SZ8 (GLOVE) ×3 IMPLANT
GLOVE BIOGEL PI IND STRL 6.5 (GLOVE) IMPLANT
GLOVE BIOGEL PI IND STRL 7.0 (GLOVE) IMPLANT
GLOVE BIOGEL PI IND STRL 8 (GLOVE) ×1 IMPLANT
GLOVE BIOGEL PI INDICATOR 6.5 (GLOVE)
GLOVE BIOGEL PI INDICATOR 7.0 (GLOVE)
GLOVE BIOGEL PI INDICATOR 8 (GLOVE) ×2
GOWN STRL REUS W/TWL LRG LVL3 (GOWN DISPOSABLE) ×3 IMPLANT
GOWN STRL REUS W/TWL XL LVL3 (GOWN DISPOSABLE) IMPLANT
HEAD FEMORAL 32 CERAMIC (Hips) ×3 IMPLANT
HOLDER FOLEY CATH W/STRAP (MISCELLANEOUS) ×3 IMPLANT
KIT TURNOVER KIT A (KITS) IMPLANT
LINER MARATHON 32 50 (Hips) ×3 IMPLANT
MANIFOLD NEPTUNE II (INSTRUMENTS) ×3 IMPLANT
PACK ANTERIOR HIP CUSTOM (KITS) ×3 IMPLANT
PENCIL SMOKE EVACUATOR COATED (MISCELLANEOUS) ×3 IMPLANT
PINNACLE SECTOR CUP 50MM (Hips) ×3 IMPLANT
STEM FEMORAL SZ 6MM STD ACTIS (Stem) ×3 IMPLANT
STRIP CLOSURE SKIN 1/2X4 (GAUZE/BANDAGES/DRESSINGS) ×4 IMPLANT
SUT ETHIBOND NAB CT1 #1 30IN (SUTURE) ×3 IMPLANT
SUT MNCRL AB 4-0 PS2 18 (SUTURE) ×3 IMPLANT
SUT STRATAFIX 0 PDS 27 VIOLET (SUTURE) ×3
SUT VIC AB 2-0 CT1 27 (SUTURE) ×4
SUT VIC AB 2-0 CT1 TAPERPNT 27 (SUTURE) ×2 IMPLANT
SUTURE STRATFX 0 PDS 27 VIOLET (SUTURE) ×1 IMPLANT
SYR 50ML LL SCALE MARK (SYRINGE) IMPLANT
TRAY FOLEY MTR SLVR 16FR STAT (SET/KITS/TRAYS/PACK) ×3 IMPLANT
YANKAUER SUCT BULB TIP 10FT TU (MISCELLANEOUS) ×3 IMPLANT

## 2020-04-22 NOTE — Anesthesia Postprocedure Evaluation (Signed)
Anesthesia Post Note  Patient: Erika Lambert  Procedure(s) Performed: TOTAL HIP ARTHROPLASTY ANTERIOR APPROACH SDDC (Right Hip)     Patient location during evaluation: PACU Anesthesia Type: Spinal Level of consciousness: oriented and awake and alert Pain management: pain level controlled Vital Signs Assessment: post-procedure vital signs reviewed and stable Respiratory status: spontaneous breathing, respiratory function stable, patient connected to nasal cannula oxygen and nonlabored ventilation Cardiovascular status: blood pressure returned to baseline and stable Postop Assessment: no headache, no backache, no apparent nausea or vomiting, spinal receding and patient able to bend at knees Anesthetic complications: no    Last Vitals:  Vitals:   04/22/20 1600 04/22/20 1700  BP: 128/68 128/86  Pulse: 66 70  Resp: 18 18  Temp:  37 C  SpO2: 99% 100%    Last Pain:  Vitals:   04/22/20 1700  TempSrc:   PainSc: [place]

## 2020-04-22 NOTE — Evaluation (Signed)
Physical Therapy Evaluation Patient Details Name: Erika Lambert MRN: [id number] DOB: [birth year] Today's Date: 04/22/2020   History of Present Illness  Patient is 54 y.o. female s/p Rt THA anterior approach on 04/22/20 with PMH significant for asthma, thyroid cancer.  Clinical Impression  Erika Lambert is a 54 y.o. female POD 0 s/p Rt THA. Patient reports independence with mobility at baseline. Patient is now limited by functional impairments (see PT problem list below) and requires min assist/guard for transfers and gait with RW. Patient was able to ambulate ~80 feet with RW and min assist. Patient limited by Rt LE cramping and poor foot clearance with gait. Patient instructed in exercise to facilitate ROM and circulation. Patient will benefit from continued skilled PT interventions to address impairments and progress towards PLOF. Acute PT will follow to progress mobility and stair training in preparation for safe discharge home.     Follow Up Recommendations Follow surgeon's recommendation for DC plan and follow-up therapies    Equipment Recommendations  Rolling walker with 5" wheels;3in1 (PT)(delivered in PACU)    Recommendations for Other Services       Precautions / Restrictions Precautions Precautions: Fall Restrictions Weight Bearing Restrictions: No Other Position/Activity Restrictions: WBAT      Mobility  Bed Mobility Overal bed mobility: Needs Assistance Bed Mobility: Supine to Sit;Sit to Supine     Supine to sit: Min guard Sit to supine: Min assist   General bed mobility comments: cues for using gait belt to manage Rt LE mobility to bring off EOB. Light assist with use of belt to raise Rt LE into bed due to pain.  Transfers Overall transfer level: Needs assistance Equipment used: Rolling walker (2 wheeled) Transfers: Sit to/from Stand Sit to Stand: Min guard;Min assist         General transfer comment: cues for safe technique to rise with RW from EOB and  from toilet. pt required assist to steady with risign from toilet/lower surface. cues for safe reach back to sit.  Ambulation/Gait Ambulation/Gait assistance: Min assist;Min guard Gait Distance (Feet): 80 Feet Assistive device: Rolling walker (2 wheeled) Gait Pattern/deviations: Step-to pattern;Decreased stride length;Decreased step length - right;Decreased stance time - right Gait velocity: decreased   General Gait Details: pt required cues for safe hand placement on RW and safe step pattern/proximity throughout gait. pt unsteady wtih poor foot clearance on Rt LE and c/o cramping and Rt thigh with LE advancement.   Stairs       Wheelchair Mobility    Modified Rankin (Stroke Patients Only)       Balance Overall balance assessment: Needs assistance Sitting-balance support: Feet supported Sitting balance-Leahy Scale: Good     Standing balance support: During functional activity;Bilateral upper extremity supported Standing balance-Leahy Scale: Fair Standing balance comment: able to maintain static standing to wash hands but unsafe for dynamic standing          Pertinent Vitals/Pain Pain Assessment: 0-10 Pain Score: 7  Pain Location: Rt hip Pain Descriptors / Indicators: Aching;Burning    Home Living Family/patient expects to be discharged to:: Private residence Living Arrangements: Spouse/significant other Available Help at Discharge: Family Type of Home: House Home Access: Stairs to enter Entrance Stairs-Rails: None Entrance Stairs-Number of Steps: 1 Home Layout: Two level;Able to live on main level with bedroom/bathroom;Full bath on main level Home Equipment: Cane - single point Additional Comments: pt mother, mother-in-law and friend will be helping    Prior Function Level of Independence: Independent  Hand Dominance   Dominant Hand: Right    Extremity/Trunk Assessment   Upper Extremity Assessment Upper Extremity Assessment: Overall  WFL for tasks assessed    Lower Extremity Assessment Lower Extremity Assessment: Overall WFL for tasks assessed;RLE deficits/detail RLE Deficits / Details: pt limited by pain and cramping in Rt LE. pt abel to perform quad set and heel slide, limited functionally wiht advancing LE during gait. RLE: Unable to fully assess due to pain RLE Sensation: WNL RLE Coordination: WNL    Cervical / Trunk Assessment Cervical / Trunk Assessment: Normal  Communication   Communication: No difficulties  Cognition Arousal/Alertness: Awake/alert Behavior During Therapy: WFL for tasks assessed/performed Overall Cognitive Status: Within Functional Limits for tasks assessed         General Comments      Exercises Total Joint Exercises Ankle Circles/Pumps: AROM;Both;20 reps;Supine Quad Sets: AROM;Right;5 reps;Supine Heel Slides: AAROM;Right;5 reps;Supine   Assessment/Plan    PT Assessment Patient needs continued PT services  PT Problem List Decreased strength;Decreased range of motion;Decreased activity tolerance;Decreased balance;Decreased mobility;Decreased knowledge of use of DME;Pain       PT Treatment Interventions Gait training;DME instruction;Functional mobility training;Stair training;Therapeutic activities;Therapeutic exercise;Balance training;Patient/family education    PT Goals (Current goals can be found in the Care Plan section)  Acute Rehab PT Goals Patient Stated Goal: to go home PT Goal Formulation: With patient Time For Goal Achievement: 04/27/20 Potential to Achieve Goals: Good    Frequency 7X/week    AM-PAC PT "6 Clicks" Mobility  Outcome Measure Help needed turning from your back to your side while in a flat bed without using bedrails?: None Help needed moving from lying on your back to sitting on the side of a flat bed without using bedrails?: A Little Help needed moving to and from a bed to a chair (including a wheelchair)?: A Little Help needed standing up from a  chair using your arms (e.g., wheelchair or bedside chair)?: A Little Help needed to walk in hospital room?: A Little Help needed climbing 3-5 steps with a railing? : A Little 6 Click Score: 19    End of Session Equipment Utilized During Treatment: Gait belt Activity Tolerance: Patient tolerated treatment well;Patient limited by pain Patient left: in bed;with call bell/phone within reach Nurse Communication: Mobility status PT Visit Diagnosis: Muscle weakness (generalized) (M62.81);Difficulty in walking, not elsewhere classified (R26.2)    Time: [id number] PT Time Calculation (min) (ACUTE ONLY): 46 min   Charges:   PT Evaluation $PT Eval Low Complexity: 1 Low PT Treatments $Gait Training: 8-22 mins $Therapeutic Exercise: 8-22 mins       Verner Mould, DPT Physical Therapist with [facility] [phone]  04/22/2020 4:39 PM

## 2020-04-22 NOTE — Anesthesia Procedure Notes (Signed)
Procedure Name: MAC Date/Time: 04/22/2020 8:13 AM Performed by: Eben Burow, CRNA Pre-anesthesia Checklist: Patient identified, Emergency Drugs available, Suction available, Patient being monitored and Timeout performed Oxygen Delivery Method: Simple face mask Dental Injury: Teeth and Oropharynx as per pre-operative assessment

## 2020-04-22 NOTE — Op Note (Signed)
OPERATIVE REPORT- TOTAL HIP ARTHROPLASTY   PREOPERATIVE DIAGNOSIS: Osteoarthritis of the Right hip.   POSTOPERATIVE DIAGNOSIS: Osteoarthritis of the Right  hip.   PROCEDURE: Right total hip arthroplasty, anterior approach.   SURGEON: Gaynelle Arabian, MD   ASSISTANT: Theresa Duty, PA-C  ANESTHESIA:  Spinal  ESTIMATED BLOOD LOSS:-350 mL    DRAINS: Hemovac x1.   COMPLICATIONS: None   CONDITION: PACU - hemodynamically stable.   BRIEF CLINICAL NOTE: Erika Lambert is a 54 y.o. female who has advanced end-  stage arthritis of their Right  hip with progressively worsening pain and  dysfunction.The patient has failed nonoperative management and presents for  total hip arthroplasty.   PROCEDURE IN DETAIL: After successful administration of spinal  anesthetic, the traction boots for the [facility] bed were placed on both  feet and the patient was placed onto the [facility] bed, boots placed into the leg  holders. The Right hip was then isolated from the perineum with plastic  drapes and prepped and draped in the usual sterile fashion. ASIS and  greater trochanter were marked and a oblique incision was made, starting  at about 1 cm lateral and 2 cm distal to the ASIS and coursing towards  the anterior cortex of the femur. The skin was cut with a 10 blade  through subcutaneous tissue to the level of the fascia overlying the  tensor fascia lata muscle. The fascia was then incised in line with the  incision at the junction of the anterior third and posterior 2/3rd. The  muscle was teased off the fascia and then the interval between the TFL  and the rectus was developed. The Hohmann retractor was then placed at  the top of the femoral neck over the capsule. The vessels overlying the  capsule were cauterized and the fat on top of the capsule was removed.  A Hohmann retractor was then placed anterior underneath the rectus  femoris to give exposure to the entire anterior capsule. A T-shaped   capsulotomy was performed. The edges were tagged and the femoral head  was identified.       Osteophytes are removed off the superior acetabulum.  The femoral neck was then cut in situ with an oscillating saw. Traction  was then applied to the left lower extremity utilizing the [facility]  traction. The femoral head was then removed. Retractors were placed  around the acetabulum and then circumferential removal of the labrum was  performed. Osteophytes were also removed. Reaming starts at 47 mm to  medialize and  Increased in 2 mm increments to 49 mm. We reamed in  approximately 40 degrees of abduction, 20 degrees anteversion. A 50 mm  pinnacle acetabular shell was then impacted in anatomic position under  fluoroscopic guidance with excellent purchase. We did not need to place  any additional dome screws. A 32 mm neutral + 4 marathon liner was then  placed into the acetabular shell.       The femoral lift was then placed along the lateral aspect of the femur  just distal to the vastus ridge. The leg was  externally rotated and capsule  was stripped off the inferior aspect of the femoral neck down to the  level of the lesser trochanter, this was done with electrocautery. The femur was lifted after this was performed. The  leg was then placed in an extended and adducted position essentially delivering the femur. We also removed the capsule superiorly and the piriformis from the piriformis  fossa to gain excellent exposure of the  proximal femur. Rongeur was used to remove some cancellous bone to get  into the lateral portion of the proximal femur for placement of the  initial starter reamer. The starter broaches was placed  the starter broach  and was shown to go down the center of the canal. Broaching  with the Actis system was then performed starting at size 0  coursing  Up to size 6. A size 6 had excellent torsional and rotational  and axial stability. The trial standard offset neck was then  placed  with a 32 + 1 trial head. The hip was then reduced. We confirmed that  the stem was in the canal both on AP and lateral x-rays. It also has excellent sizing. The hip was reduced with outstanding stability through full extension and full external rotation.. AP pelvis was taken and the leg lengths were measured and found to be equal. Hip was then dislocated again and the femoral head and neck removed. The  femoral broach was removed. Size 6 Actis stem with a standard offset  neck was then impacted into the femur following native anteversion. Has  excellent purchase in the canal. Excellent torsional and rotational and  axial stability. It is confirmed to be in the canal on AP and lateral  fluoroscopic views. The 32 + 1 ceramic head was placed and the hip  reduced with outstanding stability. Again AP pelvis was taken and it  confirmed that the leg lengths were equal. The wound was then copiously  irrigated with saline solution and the capsule reattached and repaired  with Ethibond suture. 30 ml of .25% Bupivicaine was  injected into the capsule and into the edge of the tensor fascia lata as well as subcutaneous tissue. The fascia overlying the tensor fascia lata was then closed with a running #1 V-Loc. Subcu was closed with interrupted 2-0 Vicryl and subcuticular running 4-0 Monocryl. Incision was cleaned  and dried. Steri-Strips and a bulky sterile dressing applied. Hemovac  drain was hooked to suction and then the patient was awakened and transported to  recovery in stable condition.        Please note that a surgical assistant was a medical necessity for this procedure to perform it in a safe and expeditious manner. Assistant was necessary to provide appropriate retraction of vital neurovascular structures and to prevent femoral fracture and allow for anatomic placement of the prosthesis.  Gaynelle Arabian, M.D.

## 2020-04-22 NOTE — Transfer of Care (Signed)
Immediate Anesthesia Transfer of Care Note  Patient: Erika Lambert  Procedure(s) Performed: TOTAL HIP ARTHROPLASTY ANTERIOR APPROACH SDDC (Right Hip)  Patient Location: PACU  Anesthesia Type:Spinal  Level of Consciousness: awake, alert  and oriented  Airway & Oxygen Therapy: Patient Spontanous Breathing and Patient connected to face mask oxygen  Post-op Assessment: Report given to RN and Post -op Vital signs reviewed and stable  Post vital signs: Reviewed and stable  Last Vitals:  Vitals Value Taken Time  BP 83/54 04/22/20 1001  Temp    Pulse 73 04/22/20 1002  Resp 20 04/22/20 1002  SpO2 100 % 04/22/20 1002  Vitals shown include unvalidated device data.  Last Pain:  Vitals:   04/22/20 0733  TempSrc:   PainSc: 0-No pain      Patients Stated Pain Goal: 3 ([id number] [phone])  Complications: No apparent anesthesia complications

## 2020-04-22 NOTE — Plan of Care (Signed)

## 2020-04-22 NOTE — Interval H&P Note (Signed)
History and Physical Interval Note:  04/22/2020 7:19 AM  Erika Lambert  has presented today for surgery, with the diagnosis of right hip osteoarthritis.  The various methods of treatment have been discussed with the patient and family. After consideration of risks, benefits and other options for treatment, the patient has consented to  Procedure(s) with comments: [place] (Right) - 175min as a surgical intervention.  The patient's history has been reviewed, patient examined, no change in status, stable for surgery.  I have reviewed the patient's chart and labs.  Questions were answered to the patient's satisfaction.     Pilar Plate Chanson [place]

## 2020-04-22 NOTE — Discharge Instructions (Addendum)
Erika Arabian, MD Total Joint Specialist [facility] [place] [address], [address] [place], [place] [address] [phone]       ANTERIOR APPROACH TOTAL HIP REPLACEMENT POSTOPERATIVE DIRECTIONS FOR SAME DAY DISCHARGE    Hip Rehabilitation, Guidelines Following Surgery  The results of a hip operation are greatly improved after range of motion and muscle strengthening exercises. Follow all safety measures which are given to protect your hip. If any of these exercises cause increased pain or swelling in your joint, decrease the amount until you are comfortable again. Then slowly increase the exercises. Call your caregiver if you have problems or questions.   BLOOD CLOT PREVENTION . Take a 10 mg Xarelto once a day for three weeks following surgery.  . You may resume your vitamins/supplements once you have discontinued the Xarelto. . Do not take any NSAIDs (Advil, Aleve, Ibuprofen, Meloxicam, etc.) until you have discontinued the Xarelto.   HOME CARE INSTRUCTIONS  . Remove items at home which could result in a fall. This includes throw rugs or furniture in walking pathways.   ICE to the affected hip as frequently as 20-30 minutes an hour and then as needed for pain and swelling.  Continue to use ice on the hip for pain and swelling from surgery. You may notice swelling that will progress down to the foot and ankle.  This is normal after surgery.  Elevate the leg when you are not up walking on it.    Continue to use the breathing machine which will help keep your temperature down.  It is common for your temperature to cycle up and down following surgery, especially at night when you are not up moving around and exerting yourself.  The breathing machine keeps your lungs expanded and your temperature down.  DIET You may resume your previous home diet once you are discharged from the hospital.  DRESSING / WOUND CARE / SHOWERING . You have an adhesive waterproof bandage over the  incision. Leave this in place until your first follow-up appointment. Once you remove this you will not need to place another bandage.  . You may begin showering 3 days following surgery, but do not submerge the incision under water.  ACTIVITY . For the first 3-5 days it is important to rest and keep the operative leg elevated. You should, as a general rule, rest for 50 minutes per hour and get up and walk/stretch for 10 minutes per hour. After 5 days you can slowly increase activity as tolerated. Marland Kitchen Perform the exercises you were provided twice a day for about 15-20 minutes each session. Begin these 2 days after surgery.  WEIGHT BEARING . You may bear weight as tolerated on your operative leg using a walker for assistance . Walk with your walker as instructed. Use the walker until you are comfortable transitioning to a cane. Walk with the cane in the opposite hand of the operative leg. You may discontinue the cane once you are comfortable and walking steadily.  POSTOPERATIVE CONSTIPATION PROTOCOL Constipation - defined medically as fewer than three stools per week and severe constipation as less than one stool per week.  One of the most common issues patients have following surgery is constipation.  Even if you have a regular bowel pattern at home, your normal regimen is likely to be disrupted due to multiple reasons following surgery.  Combination of anesthesia, postoperative narcotics, change in appetite and fluid intake all can affect your bowels.  In order to avoid complications  following surgery, here are some recommendations in order to help you during your recovery period.  Colace (docusate) - Pick up an over-the-counter form of Colace or another stool softener and take twice a day as long as you are requiring postoperative pain medications.  Take with a full glass of water daily.  If you experience loose stools or diarrhea, hold the colace until you stool forms back up.  If your symptoms do  not get better within 1 week or if they get worse, check with your doctor  MiraLax (polyethylene glycol) - Pick up over-the-counter to have on hand.  MiraLax is a solution that will increase the amount of water in your bowels to assist with bowel movements.  Take as directed and can mix with a glass of water, juice, soda, coffee, or tea.  Take if you go more than two days without a movement. Do not use MiraLax more than once per day. Call your doctor if you are still constipated or irregular after using this medication for 5 days in a row.  If you continue to have problems with postoperative constipation, please contact the office for further assistance and recommendations.  If you experience "the worst abdominal pain ever" or develop nausea or vomiting, please contact the office immediatly for further recommendations for treatment.  ITCHING  If you experience itching with your medications, try taking only a single pain pill, or even half a pain pill at a time.  You can also use Benadryl over the counter for itching or also to help with sleep.   TED HOSE STOCKINGS Wear the elastic stockings on both legs for three weeks following surgery during the day but you may remove then at night for sleeping.  MEDICATIONS See your medication summary on the "After Visit Summary" that the nursing staff will review with you prior to discharge.  You may have some home medications which will be placed on hold until you complete the course of blood thinner medication.  It is important for you to complete the blood thinner medication as prescribed by your surgeon.  Continue your approved medications as instructed at time of discharge.  PRECAUTIONS If you experience chest pain or shortness of breath - call 911 immediately for transfer to the hospital emergency department.  If you develop a fever greater that 101 F, purulent drainage from wound, increased redness or drainage from wound, foul odor from the  wound/dressing, or calf pain - CONTACT YOUR SURGEON.                                                   FOLLOW-UP APPOINTMENTS Make sure you keep all of your appointments after your operation with your surgeon and caregivers. You should call the office at the above phone number and make an appointment for approximately two weeks after the date of your surgery or on the date instructed by your surgeon outlined in the "After Visit Summary".  MAKE SURE YOU:  . Understand these instructions.  . Get help right away if you are not doing well or get worse.   DENTAL ANTIBIOTICS:  In most cases prophylactic antibiotics for Dental procdeures after total joint surgery are not necessary.  Exceptions are as follows:  1. History of prior total joint infection  2. Severely immunocompromised (Organ Transplant, cancer chemotherapy, Rheumatoid biologic meds such as [place]  3. Poorly controlled diabetes (A1C &gt; 8.0, blood glucose over 200)  If you have one of these conditions, contact your surgeon for an antibiotic prescription, prior to your dental procedure.   Pick up stool softner and laxative for home use following surgery while on pain medications. May shower starting three days after surgery. Continue to use ice for pain and swelling after surgery. Do not use any lotions or creams on the incision until instructed by your surgeon.  Information on my medicine - XARELTO (Rivaroxaban)  Why was Xarelto prescribed for you? Xarelto was prescribed for you to reduce the risk of blood clots forming after orthopedic surgery. The medical term for these abnormal blood clots is venous thromboembolism (VTE).  What do you need to know about xarelto ? Take your Xarelto ONCE DAILY at the same time every day. You may take it either with or without food.  If you have difficulty swallowing the tablet whole, you may crush it and mix in applesauce just prior to taking your dose.  Take Xarelto exactly as  prescribed by your doctor and DO NOT stop taking Xarelto without talking to the doctor who prescribed the medication.  Stopping without other VTE prevention medication to take the place of Xarelto may increase your risk of developing a clot.  After discharge, you should have regular check-up appointments with your healthcare provider that is prescribing your Xarelto.    What do you do if you miss a dose? If you miss a dose, take it as soon as you remember on the same day then continue your regularly scheduled once daily regimen the next day. Do not take two doses of Xarelto on the same day.   Important Safety Information A possible side effect of Xarelto is bleeding. You should call your healthcare provider right away if you experience any of the following: ? Bleeding from an injury or your nose that does not stop. ? Unusual colored urine (red or dark brown) or unusual colored stools (red or black). ? Unusual bruising for unknown reasons. ? A serious fall or if you hit your head (even if there is no bleeding).  Some medicines may interact with Xarelto and might increase your risk of bleeding while on Xarelto. To help avoid this, consult your healthcare provider or pharmacist prior to using any new prescription or non-prescription medications, including herbals, vitamins, non-steroidal anti-inflammatory drugs (NSAIDs) and supplements.  This website has more information on Xarelto: [email or web address]

## 2020-04-22 NOTE — Anesthesia Procedure Notes (Signed)
Spinal  Patient location during procedure: OR Start time: 04/22/2020 8:15 AM End time: 04/22/2020 8:25 AM Staffing Performed: anesthesiologist  Anesthesiologist: Catalina Gravel, MD Preanesthetic Checklist Completed: patient identified, IV checked, risks and benefits discussed, surgical consent, monitors and equipment checked, pre-op evaluation and timeout performed Spinal Block Patient position: sitting Prep: DuraPrep and site prepped and draped Patient monitoring: continuous pulse ox and blood pressure Approach: midline Location: L3-4 Injection technique: single-shot Needle Needle type: Pencan and Quincke  Needle gauge: 22 G Needle length: 5 cm Assessment Sensory level: T8 Additional Notes Functioning IV was confirmed and monitors were applied. Sterile prep and drape, including hand hygiene, mask and sterile gloves were used. The patient was positioned and the spine was prepped. The skin was anesthetized with lidocaine.  Free flow of clear CSF was obtained prior to injecting local anesthetic into the CSF.  The spinal needle aspirated freely following injection.  The needle was carefully withdrawn.  The patient tolerated the procedure well. Consent was obtained prior to procedure with all questions answered and concerns addressed. Risks including but not limited to bleeding, infection, nerve damage, paralysis, failed block, inadequate analgesia, allergic reaction, high spinal, itching and headache were discussed and the patient wished to proceed.   Attempts at 3 levels.  Hoy Morn, MD

## 2020-04-23 ENCOUNTER — Encounter: Payer: Self-pay | Admitting: *Deleted

## 2020-04-23 DIAGNOSIS — Z79890 Hormone replacement therapy (postmenopausal): Secondary | ICD-10-CM | POA: Diagnosis not present

## 2020-04-23 DIAGNOSIS — Z9884 Bariatric surgery status: Secondary | ICD-10-CM | POA: Diagnosis not present

## 2020-04-23 DIAGNOSIS — Z85850 Personal history of malignant neoplasm of thyroid: Secondary | ICD-10-CM | POA: Diagnosis not present

## 2020-04-23 DIAGNOSIS — Z96641 Presence of right artificial hip joint: Secondary | ICD-10-CM | POA: Diagnosis not present

## 2020-04-23 DIAGNOSIS — R7303 Prediabetes: Secondary | ICD-10-CM | POA: Diagnosis not present

## 2020-04-23 DIAGNOSIS — Z6841 Body Mass Index (BMI) 40.0 and over, adult: Secondary | ICD-10-CM | POA: Diagnosis not present

## 2020-04-23 DIAGNOSIS — Z79899 Other long term (current) drug therapy: Secondary | ICD-10-CM | POA: Diagnosis not present

## 2020-04-23 DIAGNOSIS — E039 Hypothyroidism, unspecified: Secondary | ICD-10-CM | POA: Diagnosis not present

## 2020-04-23 DIAGNOSIS — Z7902 Long term (current) use of antithrombotics/antiplatelets: Secondary | ICD-10-CM | POA: Diagnosis not present

## 2020-04-23 DIAGNOSIS — J45909 Unspecified asthma, uncomplicated: Secondary | ICD-10-CM | POA: Diagnosis not present

## 2020-04-23 DIAGNOSIS — M1611 Unilateral primary osteoarthritis, right hip: Secondary | ICD-10-CM | POA: Diagnosis not present

## 2020-04-23 DIAGNOSIS — Z96649 Presence of unspecified artificial hip joint: Secondary | ICD-10-CM | POA: Diagnosis not present

## 2020-04-23 LAB — CBC
HCT: 35.4 % — ABNORMAL LOW (ref 36.0–46.0)
Hemoglobin: 11.5 g/dL — ABNORMAL LOW (ref 12.0–15.0)
MCH: 31.1 pg (ref 26.0–34.0)
MCHC: 32.5 g/dL (ref 30.0–36.0)
MCV: 95.7 fL (ref 80.0–100.0)
Platelets: 194 10*3/uL (ref 150–400)
RBC: 3.7 MIL/uL — ABNORMAL LOW (ref 3.87–5.11)
RDW: 13.6 % (ref 11.5–15.5)
WBC: 13.8 10*3/uL — ABNORMAL HIGH (ref 4.0–10.5)
nRBC: 0 % (ref 0.0–0.2)

## 2020-04-23 LAB — BASIC METABOLIC PANEL
Anion gap: 8 (ref 5–15)
BUN: 9 mg/dL (ref 6–20)
CO2: 25 mmol/L (ref 22–32)
Calcium: 8.8 mg/dL — ABNORMAL LOW (ref 8.9–10.3)
Chloride: 104 mmol/L (ref 98–111)
Creatinine, Ser: 0.57 mg/dL (ref 0.44–1.00)
GFR calc Af Amer: >60 mL/min (ref >60)
GFR calc non Af Amer: >60 mL/min (ref >60)
Glucose, Bld: 136 mg/dL — ABNORMAL HIGH (ref 70–99)
Potassium: 4.4 mmol/L (ref 3.5–5.1)
Sodium: 137 mmol/L (ref 135–145)

## 2020-04-23 NOTE — TOC Transition Note (Signed)
Transition of Care [facility]) - CM/SW Discharge Note   Patient Details  Name: Erika Lambert MRN: [id number] Date of Birth: 04/29/1966  Transition of Care [facility]) CM/SW Contact:  Lennart Pall, LCSW Phone Number: 04/23/2020, 10:31 AM   Clinical Narrative:   Met very briefly with pt and confirmed DME delivered and plan for home with HEP.  No TOC needs.    Final next level of care: Home/Self Care Barriers to Discharge: No Barriers Identified   Patient Goals and CMS Choice        Discharge Placement                       Discharge Plan and Services                DME Arranged: 3-N-1, Walker rolling DME Agency: [employer] Date DME Agency Contacted: (arranged PTA via MD office)     HH Arranged: NA [place] Agency: NA        Social Determinants of Health (SDOH) Interventions     Readmission Risk Interventions No flowsheet data found.

## 2020-04-23 NOTE — Progress Notes (Signed)
   Subjective: 1 Day Post-Op Procedure(s) (LRB): TOTAL HIP ARTHROPLASTY ANTERIOR APPROACH SDDC (Right) Patient reports pain as mild.   Patient seen in rounds for Dr. Wynelle Link. Patient is well, and has had no acute complaints or problems other than pain in the right hip. Denies chest pain or SOB. No issues overnight. Voiding without difficulty. We will continue therapy today.   Objective: Vital signs in last 24 hours: Temp:  [97.3 F (36.3 C)-98.6 F (37 C)] 98 F (36.7 C) (05/27 0510) Pulse Rate:  [54-72] 58 (05/27 0510) Resp:  [11-21] 18 (05/27 0510) BP: (84-133)/(31-92) 118/71 (05/27 0510) SpO2:  [95 %-100 %] 100 % (05/27 0510)  Intake/Output from previous day:  Intake/Output Summary (Last 24 hours) at 04/23/2020 0800 Last data filed at 04/23/2020 0600 Gross per 24 hour  Intake 3758.35 ml  Output 2700 ml  Net 1058.35 ml     Intake/Output this shift: No intake/output data recorded.  Labs: Recent Labs    04/23/20 0314  HGB 11.5*   Recent Labs    04/23/20 0314  WBC 13.8*  RBC 3.70*  HCT 35.4*  PLT 194   Recent Labs    04/23/20 0314  NA 137  K 4.4  CL 104  CO2 25  BUN 9  CREATININE 0.57  GLUCOSE 136*  CALCIUM 8.8*   No results for input(s): LABPT, INR in the last 72 hours.  Exam: General - Patient is Alert and Oriented Extremity - Neurologically intact Neurovascular intact Sensation intact distally Dorsiflexion/Plantar flexion intact Dressing - dressing C/D/I Motor Function - intact, moving foot and toes well on exam.   Past Medical History:  Diagnosis Date  . Asthma 2016  . Cancer [facility]) 2009   thyroid  . Pre-diabetes     Assessment/Plan: 1 Day Post-Op Procedure(s) (LRB): TOTAL HIP ARTHROPLASTY ANTERIOR APPROACH SDDC (Right) Principal Problem:   OA (osteoarthritis) of hip Active Problems:   S/P total hip arthroplasty  Estimated body mass index is 40.65 kg/m as calculated from the following:   Height as of this encounter: 5\' 5"  (1.651  m).   Weight as of this encounter: 110.8 kg. Advance diet Up with therapy D/C IV fluids  DVT Prophylaxis - Xarelto Weight bearing as tolerated. D/C O2 and pulse ox and try on room air. Continue therapy.  Plan is to go Home after hospital stay. Plan for discharge around lunch after one session of PT this AM with HEP. Follow-up in the office on June 8th.  Theresa Duty, PA-C Orthopedic Surgery [phone] 04/23/2020, 8:00 AM

## 2020-04-23 NOTE — Progress Notes (Signed)
Physical Therapy Treatment Patient Details Name: Erika Lambert MRN: [id number] DOB: January 23, 1966 Today's Date: 04/23/2020    History of Present Illness Patient is 54 y.o. female s/p Rt THA anterior approach on 04/22/20 with PMH significant for asthma, thyroid cancer.    PT Comments    POD # 1 pm session Assisted OOB to amb to bathroom.  General bed mobility comments: using her belt and increased time.  General transfer comment: <25% VC's on safety with turns.  General Gait Details: <25% VC's on proper walker to self distance and safety with turns.  Tolerated an increased distance. Practiced one step.  General stair comments: 25% VC's on proper walker placement and proper sequencing.  Then returned to room to perform some TE's following HEP handout.  Instructed on proper tech, freq as well as use of ICE.  Addressed all mobility questions, discussed appropriate activity, educated on use of ICE.  Pt ready for D/C to home.    Follow Up Recommendations  Follow surgeon's recommendation for DC plan and follow-up therapies     Equipment Recommendations  Rolling walker with 5" wheels;3in1 (PT)    Recommendations for Other Services       Precautions / Restrictions Precautions Precautions: Fall Restrictions Weight Bearing Restrictions: No Other Position/Activity Restrictions: WBAT    Mobility  Bed Mobility Overal bed mobility: Needs Assistance Bed Mobility: Supine to Sit;Sit to Supine     Supine to sit: Supervision;Min guard Sit to supine: Supervision;Min guard   General bed mobility comments: using her belt and increased time  Transfers Overall transfer level: Needs assistance Equipment used: Rolling walker (2 wheeled) Transfers: Sit to/from [facility] Sit to Stand: Supervision Stand pivot transfers: Supervision;Min guard       General transfer comment: <25% VC's on safety with turns  Ambulation/Gait Ambulation/Gait assistance: Supervision;Min guard Gait  Distance (Feet): 115 Feet Assistive device: Rolling walker (2 wheeled) Gait Pattern/deviations: Step-to pattern;Decreased stride length;Decreased step length - right;Decreased stance time - right Gait velocity: decreased   General Gait Details: <25% VC's on proper walker to self distance and safety with turns.  Tolerated an increased distance.   Stairs Stairs: Yes   Stair Management: No rails;Step to pattern;Forwards Number of Stairs: 1 General stair comments: 25% VC's on proper walker placement and proper sequening   Wheelchair Mobility    Modified Rankin (Stroke Patients Only)       Balance                                            Cognition Arousal/Alertness: Awake/alert Behavior During Therapy: WFL for tasks assessed/performed Overall Cognitive Status: Within Functional Limits for tasks assessed                                 General Comments: AxO x 4 pleasant/motivated      Exercises  5 reps all standing TE's following HEP handout.      General Comments        Pertinent Vitals/Pain Pain Assessment: 0-10 Pain Score: 3  Pain Location: Rt hip Pain Descriptors / Indicators: Aching;Burning;Tender;Sore;Operative site guarding Pain Intervention(s): Monitored during session;Premedicated before session;Repositioned;Ice applied    Home Living                      Prior Function  PT Goals (current goals can now be found in the care plan section) Progress towards PT goals: Progressing toward goals    Frequency    7X/week      PT Plan Current plan remains appropriate    Co-evaluation              AM-PAC PT "6 Clicks" Mobility   Outcome Measure  Help needed turning from your back to your side while in a flat bed without using bedrails?: None Help needed moving from lying on your back to sitting on the side of a flat bed without using bedrails?: A Little Help needed moving to and from a bed to  a chair (including a wheelchair)?: A Little Help needed standing up from a chair using your arms (e.g., wheelchair or bedside chair)?: A Little Help needed to walk in hospital room?: A Little Help needed climbing 3-5 steps with a railing? : A Little 6 Click Score: 19    End of Session Equipment Utilized During Treatment: Gait belt Activity Tolerance: Patient tolerated treatment well;Patient limited by pain Patient left: in bed;with call bell/phone within reach Nurse Communication: Mobility status PT Visit Diagnosis: Muscle weakness (generalized) (M62.81);Difficulty in walking, not elsewhere classified (R26.2)     Time: 1020-1045 PT Time Calculation (min) (ACUTE ONLY): 25 min  Charges:  $Gait Training: 8-22 mins $Therapeutic Exercise: 8-22 mins                     Rica Koyanagi  PTA Acute  Rehabilitation Services Pager      [phone] Office      [phone]

## 2020-04-23 NOTE — Progress Notes (Signed)
Physical Therapy Treatment Patient Details Name: Erika Lambert MRN: [id number] DOB: [birth year] Today's Date: 04/23/2020    History of Present Illness Patient is 54 y.o. female s/p Rt THA anterior approach on 04/22/20 with PMH significant for asthma, thyroid cancer.    PT Comments    POD # 1 am session Assisted OOB to bathroom.  General bed mobility comments: using her belt and increased time.  General transfer comment: <25% VC's on safety with turns.  General Gait Details: <25% VC's on proper walker to self distance and safety with turns.  Then returned to room to perform some TE's following HEP handout.  Instructed on proper tech, freq as well as use of ICE.  Pt will need another PT session to complete HEP and practice on step.     Follow Up Recommendations  Follow surgeon's recommendation for DC plan and follow-up therapies     Equipment Recommendations  Rolling walker with 5" wheels;3in1 (PT)    Recommendations for Other Services       Precautions / Restrictions Precautions Precautions: Fall Restrictions Weight Bearing Restrictions: No Other Position/Activity Restrictions: WBAT    Mobility  Bed Mobility Overal bed mobility: Needs Assistance Bed Mobility: Supine to Sit;Sit to Supine     Supine to sit: Supervision;Min guard Sit to supine: Supervision;Min guard   General bed mobility comments: using her belt and increased time  Transfers Overall transfer level: Needs assistance Equipment used: Rolling walker (2 wheeled) Transfers: Sit to/from [facility] Sit to Stand: Supervision Stand pivot transfers: Supervision;Min guard       General transfer comment: <25% VC's on safety with turns  Ambulation/Gait Ambulation/Gait assistance: Supervision;Min guard Gait Distance (Feet): 85 Feet Assistive device: Rolling walker (2 wheeled) Gait Pattern/deviations: Step-to pattern;Decreased stride length;Decreased step length - right;Decreased stance time -  right Gait velocity: decreased   General Gait Details: <25% VC's on proper walker to self distance and safety with turns   Marine scientist Rankin (Stroke Patients Only)       Balance                                            Cognition Arousal/Alertness: Awake/alert Behavior During Therapy: WFL for tasks assessed/performed Overall Cognitive Status: Within Functional Limits for tasks assessed                                 General Comments: AxO x 4 pleasant/motivated      Exercises   Total Hip Replacement TE's following HEP Handout 10 reps ankle pumps 05 reps knee presses 05 reps heel slides 05 reps SAQ's 05 reps ABD Instructed how to use a belt loop to assist  Followed by ICE     General Comments        Pertinent Vitals/Pain Pain Assessment: 0-10 Pain Score: 3  Pain Location: Rt hip Pain Descriptors / Indicators: Aching;Burning;Tender;Sore;Operative site guarding Pain Intervention(s): Monitored during session;Premedicated before session;Repositioned;Ice applied    Home Living                      Prior Function            PT Goals (current goals can now be found in the  care plan section) Progress towards PT goals: Progressing toward goals    Frequency    7X/week      PT Plan Current plan remains appropriate    Co-evaluation              AM-PAC PT "6 Clicks" Mobility   Outcome Measure  Help needed turning from your back to your side while in a flat bed without using bedrails?: None Help needed moving from lying on your back to sitting on the side of a flat bed without using bedrails?: A Little Help needed moving to and from a bed to a chair (including a wheelchair)?: A Little Help needed standing up from a chair using your arms (e.g., wheelchair or bedside chair)?: A Little Help needed to walk in hospital room?: A Little Help needed climbing 3-5 steps  with a railing? : A Little 6 Click Score: 19    End of Session Equipment Utilized During Treatment: Gait belt Activity Tolerance: Patient tolerated treatment well;Patient limited by pain Patient left: in bed;with call bell/phone within reach Nurse Communication: Mobility status PT Visit Diagnosis: Muscle weakness (generalized) (M62.81);Difficulty in walking, not elsewhere classified (R26.2)     Time: 1020-1045 PT Time Calculation (min) (ACUTE ONLY): 25 min  Charges:  $Gait Training: 8-22 mins $Therapeutic Exercise: 8-22 mins                     Rica Koyanagi  PTA Acute  Rehabilitation Services Pager      [phone] Office      [phone]

## 2020-04-28 NOTE — Discharge Summary (Signed)
Physician Discharge Summary   Patient ID: Erika Lambert MRN: [id number] DOB/AGE: 1966/10/17 54 y.o.  Admit date: 04/22/2020 Discharge date: 04/23/2020  Primary Diagnosis: Osteoarthritis, right hip   Admission Diagnoses:  Past Medical History:  Diagnosis Date  . Asthma 2016  . Cancer [facility]) 2009   thyroid  . Pre-diabetes    Discharge Diagnoses:   Principal Problem:   OA (osteoarthritis) of hip Active Problems:   S/P total hip arthroplasty  Estimated body mass index is 40.65 kg/m as calculated from the following:   Height as of this encounter: 5\' 5"  (1.651 m).   Weight as of this encounter: 110.8 kg.  Procedure:  Procedure(s) (LRB): TOTAL HIP ARTHROPLASTY ANTERIOR APPROACH SDDC (Right)   Consults: None  HPI: Erika Lambert is a 54 y.o. female who has advanced end-  stage arthritis of their Right  hip with progressively worsening pain and  dysfunction.The patient has failed nonoperative management and presents for total hip arthroplasty.   Laboratory Data: Admission on 04/22/2020, Discharged on 04/23/2020  Component Date Value Ref Range Status  . WBC 04/23/2020 13.8* 4.0 - 10.5 K/uL Final  . RBC 04/23/2020 3.70* 3.87 - 5.11 MIL/uL Final  . Hemoglobin 04/23/2020 11.5* 12.0 - 15.0 g/dL Final  . HCT 04/23/2020 35.4* 36.0 - 46.0 % Final  . MCV 04/23/2020 95.7  80.0 - 100.0 fL Final  . MCH 04/23/2020 31.1  26.0 - 34.0 pg Final  . MCHC 04/23/2020 32.5  30.0 - 36.0 g/dL Final  . RDW 04/23/2020 13.6  11.5 - 15.5 % Final  . Platelets 04/23/2020 194  150 - 400 K/uL Final  . nRBC 04/23/2020 0.0  0.0 - 0.2 % Final   Performed at [facility], [place] [address] [place], [place] [address]  . Sodium 04/23/2020 137  135 - 145 mmol/L Final  . Potassium 04/23/2020 4.4  3.5 - 5.1 mmol/L Final  . Chloride 04/23/2020 104  98 - 111 mmol/L Final  . CO2 04/23/2020 25  22 - 32 mmol/L Final  . Glucose, Bld 04/23/2020 136* 70 - 99 mg/dL Final   Glucose reference range  applies only to samples taken after fasting for at least 8 hours.  . BUN 04/23/2020 9  6 - 20 mg/dL Final  . Creatinine, Ser 04/23/2020 0.57  0.44 - 1.00 mg/dL Final  . Calcium 04/23/2020 8.8* 8.9 - 10.3 mg/dL Final  . GFR calc non Af Amer 04/23/2020 >60  >60 mL/min Final  . GFR calc Af Amer 04/23/2020 >60  >60 mL/min Final  . Anion gap 04/23/2020 8  5 - 15 Final   Performed at [facility], [place] [address], [place], [place] [address]  Hospital Outpatient Visit on 04/18/2020  Component Date Value Ref Range Status  . SARS Coronavirus 2 04/18/2020 NEGATIVE  NEGATIVE Final   Comment: (NOTE) SARS-CoV-2 target nucleic acids are NOT DETECTED. The SARS-CoV-2 RNA is generally detectable in upper and lower respiratory specimens during the acute phase of infection. Negative results do not preclude SARS-CoV-2 infection, do not rule out co-infections with other pathogens, and should not be used as the sole basis for treatment or other patient management decisions. Negative results must be combined with clinical observations, patient history, and epidemiological information. The expected result is Negative. Fact Sheet for Patients: SugarRoll.be Fact Sheet for Healthcare Providers: https://www.woods-mathews.com/ This test is not yet approved or cleared by the [place] FDA and  has been authorized for detection and/or diagnosis of SARS-CoV-2 by FDA  under an Emergency Use Authorization (EUA). This EUA will remain  in effect (meaning this test can be used) for the duration of the COVID-19 declaration under Section 56                          4(b)(1) of the Act, 21 U.S.C. section 360bbb-3(b)(1), unless the authorization is terminated or revoked sooner. Performed at [facility], [place] [address] [place], [place] [address]   Hospital Outpatient Visit on 04/14/2020  Component Date Value Ref Range Status  . Hgb A1c MFr Bld  04/14/2020 5.8* 4.8 - 5.6 % Final   Comment: (NOTE)         Prediabetes: 5.7 - 6.4         Diabetes: >6.4         Glycemic control for adults with diabetes: <7.0   . Mean Plasma Glucose 04/14/2020 120  mg/dL Final   Comment: (NOTE) Performed At: [facility] [place], [place] [id number] Rush Farmer MD [id number]   . aPTT 04/14/2020 28  24 - 36 seconds Final   Performed at [facility], [place] [address] [place], [place] [address]  . WBC 04/14/2020 6.2  4.0 - 10.5 K/uL Final  . RBC 04/14/2020 4.47  3.87 - 5.11 MIL/uL Final  . Hemoglobin 04/14/2020 13.8  12.0 - 15.0 g/dL Final  . HCT 04/14/2020 43.0  36.0 - 46.0 % Final  . MCV 04/14/2020 96.2  80.0 - 100.0 fL Final  . MCH 04/14/2020 30.9  26.0 - 34.0 pg Final  . MCHC 04/14/2020 32.1  30.0 - 36.0 g/dL Final  . RDW 04/14/2020 13.7  11.5 - 15.5 % Final  . Platelets 04/14/2020 188  150 - 400 K/uL Final  . nRBC 04/14/2020 0.0  0.0 - 0.2 % Final   Performed at [facility], [place] [address] [place], [place] [address]  . Sodium 04/14/2020 138  135 - 145 mmol/L Final  . Potassium 04/14/2020 4.3  3.5 - 5.1 mmol/L Final  . Chloride 04/14/2020 104  98 - 111 mmol/L Final  . CO2 04/14/2020 28  22 - 32 mmol/L Final  . Glucose, Bld 04/14/2020 98  70 - 99 mg/dL Final   Glucose reference range applies only to samples taken after fasting for at least 8 hours.  . BUN 04/14/2020 14  6 - 20 mg/dL Final  . Creatinine, Ser 04/14/2020 0.68  0.44 - 1.00 mg/dL Final  . Calcium 04/14/2020 9.1  8.9 - 10.3 mg/dL Final  . Total Protein 04/14/2020 7.1  6.5 - 8.1 g/dL Final  . Albumin 04/14/2020 4.1  3.5 - 5.0 g/dL Final  . AST 04/14/2020 22  15 - 41 U/L Final  . ALT 04/14/2020 12  0 - 44 U/L Final  . Alkaline Phosphatase 04/14/2020 69  38 - 126 U/L Final  . Total Bilirubin 04/14/2020 0.6  0.3 - 1.2 mg/dL Final  . GFR calc non Af Amer 04/14/2020 >60  >60 mL/min Final  . GFR calc Af Amer 04/14/2020  >60  >60 mL/min Final  . Anion gap 04/14/2020 6  5 - 15 Final   Performed at [facility], [place] [address], [place],  [address]  . Prothrombin Time 04/14/2020 14.7  11.4 - 15.2 seconds Final  . INR 04/14/2020 1.2  0.8 - 1.2 Final   Comment: (NOTE) INR goal varies based on device and disease states. Performed at [facility]  [place] [address] [place], [place] [address]   . ABO/RH(D) 04/14/2020 B POS   Final  . Antibody Screen 04/14/2020 NEG   Final  . Sample Expiration 04/14/2020 04/25/2020,2359   Final  . Extend sample reason 04/14/2020    Final                   Value:NO TRANSFUSIONS OR PREGNANCY IN THE PAST 3 MONTHS Performed at [place] [address], [place], [place] [address]   . ABO/RH(D) 04/14/2020    Final                   Value:B POS Performed at [facility], [place] [address], [place], [place] [address]      X-Rays:DG Pelvis Portable  Result Date: 04/22/2020 CLINICAL DATA:  Right total hip arthroplasty EXAM: PORTABLE PELVIS 1-2 VIEWS COMPARISON:  None. FINDINGS: Status post right total hip arthroplasty with well-positioned right acetabular and right proximal femoral prostheses. No evidence of hip dislocation on this single frontal view. No acute osseous fracture. No focal osseous lesions. Expected soft tissue gas surrounding the right hip joint. Essure tubal microinserts noted bilaterally in the deep pelvis. IMPRESSION: Satisfactory immediate postoperative frontal view appearance status post right total hip arthroplasty. Electronically Signed   By: Ilona Sorrel M.D.   On: 04/22/2020 11:11   DG C-Arm 1-60 Min-No Report  Result Date: 04/22/2020 Fluoroscopy was utilized by the requesting physician.  No radiographic interpretation.   DG HIP OPERATIVE UNILAT W OR W/O PELVIS RIGHT  Result Date: 04/22/2020 CLINICAL DATA:  Status post right total hip replacement EXAM: OPERATIVE RIGHT HIP (WITH  PELVIS IF PERFORMED) 2 VIEWS TECHNIQUE: Fluoroscopic spot image(s) were submitted for interpretation post-operatively. COMPARISON:  None. FLUOROSCOPY TIME:  0 minutes 11 seconds; 4 acquired images FINDINGS: Initial frontal view shows moderate osteoarthritic change in the right hip joint. No fracture or dislocation. Apparent Essure type devices noted in the left and right pelvic regions. There is narrowing of the left hip joint as well. Subsequently, there is frontal imaging of a total hip replacement right with prosthetic components well-seated on frontal view. No fracture or dislocation evident. IMPRESSION: Status post total hip replacement on the right with prosthetic components well-seated on frontal view. No fracture or dislocation. Essure type devices noted in each fallopian tube region. Electronically Signed   By: Lowella Grip III M.D.   On: 04/22/2020 09:56    EKG:No orders found for this or any previous visit.   Hospital Course: Erika Lambert is a 54 y.o. who was admitted to [facility]. They were brought to the operating room on 04/22/2020 and underwent Procedure(s): [place].  Patient tolerated the procedure well and was later transferred to the recovery room and then to the orthopaedic floor for postoperative care. They were given PO and IV analgesics for pain control following their surgery. They were given 24 hours of postoperative antibiotics of  Anti-infectives (From admission, onward)   Start     Dose/Rate Route Frequency Ordered Stop   04/22/20 1652  ceFAZolin (ANCEF) 2-4 GM/100ML-% IVPB    Note to Pharmacy: Lanell Persons  : cabinet override      04/22/20 1652 04/23/20 0459   04/22/20 1400  ceFAZolin (ANCEF) IVPB 2g/100 mL premix     2 g 200 mL/hr over 30 Minutes Intravenous Every 6 hours 04/22/20 1108 04/22/20 2126   04/22/20 0730  ceFAZolin (ANCEF) IVPB  2g/100 mL premix     2 g 200 mL/hr over 30 Minutes Intravenous On call to  O.R. 04/22/20 [id number] 04/22/20 0826     and started on DVT prophylaxis in the form of Xarelto.   PT and OT were ordered for total joint protocol. Discharge planning consulted to help with postop disposition and equipment needs.  Patient had a good night on the evening of surgery. They started to get up OOB with therapy on POD #0. Pt was seen during rounds and was ready to go home pending progress with therapy. Hemovac drain was pulled without difficulty. She worked with therapy on POD #1 and was meeting her goals. Pt was discharged to home later that day in stable condition.  Diet: Regular diet Activity: WBAT Follow-up: in 2 weeks Disposition: Home with HEP Discharged Condition: stable   Discharge Instructions    Call MD / Call 911   Complete by: As directed    If you experience chest pain or shortness of breath, CALL 911 and be transported to the hospital emergency room.  If you develope a fever above 101 F, pus (white drainage) or increased drainage or redness at the wound, or calf pain, call your surgeon's office.   Change dressing   Complete by: As directed    You have an adhesive waterproof bandage over the incision. Leave this in place until your first follow-up appointment. Once you remove this you will not need to place another bandage.   Constipation Prevention   Complete by: As directed    Drink plenty of fluids.  Prune juice may be helpful.  You may use a stool softener, such as Colace (over the counter) 100 mg twice a day.  Use MiraLax (over the counter) for constipation as needed.   Diet - low sodium heart healthy   Complete by: As directed    Do not sit on low chairs, stoools or toilet seats, as it may be difficult to get up from low surfaces   Complete by: As directed    Driving restrictions   Complete by: As directed    No driving for two weeks   TED hose   Complete by: As directed    Use stockings (TED hose) for three weeks on both leg(s).  You may remove them at night for  sleeping.   Weight bearing as tolerated   Complete by: As directed      Allergies as of 04/23/2020      Reactions   Albuterol Hives   Latex Other (See Comments)   welts   Penicillins Hives, Other (See Comments)   Dizziness Tolerated Cephalosporin Date: 04/23/20.      Medication List    STOP taking these medications   HAIR/SKIN/NAILS PO   multivitamin with minerals Tabs tablet     TAKE these medications   acetaminophen 500 MG tablet Commonly known as: TYLENOL Take 500-1,000 mg by mouth every 6 (six) hours as needed (pain/headaches.).   HYDROcodone-acetaminophen 5-325 MG tablet Commonly known as: NORCO/VICODIN Take 1-2 tablets by mouth every 6 (six) hours as needed for severe pain.   levothyroxine 125 MCG tablet Commonly known as: SYNTHROID Take 125 mcg by mouth daily before breakfast.   methocarbamol 500 MG tablet Commonly known as: Robaxin Take 1 tablet (500 mg total) by mouth every 6 (six) hours as needed for muscle spasms.   rivaroxaban 10 MG Tabs tablet Commonly known as: XARELTO Take 1 tablet (10 mg total) by mouth daily for 21 days.  Blood clot prevention.   traMADol 50 MG tablet Commonly known as: Ultram Take 1-2 tablets (50-100 mg total) by mouth every 6 (six) hours as needed for moderate pain.            Discharge Care Instructions  (From admission, onward)         Start     Ordered   04/23/20 0000  Weight bearing as tolerated     04/23/20 0803   04/23/20 0000  Change dressing    Comments: You have an adhesive waterproof bandage over the incision. Leave this in place until your first follow-up appointment. Once you remove this you will not need to place another bandage.   04/23/20 0803         Follow-up Information    Gaynelle Arabian, MD. Schedule an appointment as soon as possible for a visit on 05/05/2020.   Specialty: Orthopedic Surgery Contact information: [address] [place] [address] [id number]            Signed: Theresa Duty, PA-C Orthopedic Surgery 04/28/2020, 12:54 PM

## 2020-05-26 DIAGNOSIS — Z96641 Presence of right artificial hip joint: Secondary | ICD-10-CM | POA: Diagnosis not present

## 2020-05-26 DIAGNOSIS — Z471 Aftercare following joint replacement surgery: Secondary | ICD-10-CM | POA: Diagnosis not present

## 2020-07-21 DIAGNOSIS — Z85850 Personal history of malignant neoplasm of thyroid: Secondary | ICD-10-CM | POA: Diagnosis not present

## 2020-07-21 DIAGNOSIS — E890 Postprocedural hypothyroidism: Secondary | ICD-10-CM | POA: Diagnosis not present

## 2020-07-21 DIAGNOSIS — L659 Nonscarring hair loss, unspecified: Secondary | ICD-10-CM | POA: Diagnosis not present

## 2020-07-21 DIAGNOSIS — L680 Hirsutism: Secondary | ICD-10-CM | POA: Diagnosis not present

## 2020-07-23 DIAGNOSIS — L709 Acne, unspecified: Secondary | ICD-10-CM | POA: Diagnosis not present

## 2020-07-23 DIAGNOSIS — F488 Other specified nonpsychotic mental disorders: Secondary | ICD-10-CM | POA: Diagnosis not present

## 2020-07-23 DIAGNOSIS — N950 Postmenopausal bleeding: Secondary | ICD-10-CM | POA: Diagnosis not present

## 2020-07-23 DIAGNOSIS — E890 Postprocedural hypothyroidism: Secondary | ICD-10-CM | POA: Diagnosis not present

## 2020-07-23 DIAGNOSIS — L680 Hirsutism: Secondary | ICD-10-CM | POA: Diagnosis not present

## 2020-07-23 DIAGNOSIS — L659 Nonscarring hair loss, unspecified: Secondary | ICD-10-CM | POA: Diagnosis not present

## 2020-08-25 DIAGNOSIS — E890 Postprocedural hypothyroidism: Secondary | ICD-10-CM | POA: Diagnosis not present

## 2020-08-25 DIAGNOSIS — L680 Hirsutism: Secondary | ICD-10-CM | POA: Diagnosis not present

## 2020-08-25 DIAGNOSIS — Z85850 Personal history of malignant neoplasm of thyroid: Secondary | ICD-10-CM | POA: Diagnosis not present

## 2020-08-25 DIAGNOSIS — E282 Polycystic ovarian syndrome: Secondary | ICD-10-CM | POA: Diagnosis not present

## 2020-09-04 DIAGNOSIS — R7303 Prediabetes: Secondary | ICD-10-CM | POA: Diagnosis not present

## 2020-09-04 DIAGNOSIS — Z85850 Personal history of malignant neoplasm of thyroid: Secondary | ICD-10-CM | POA: Diagnosis not present

## 2020-09-04 DIAGNOSIS — G4730 Sleep apnea, unspecified: Secondary | ICD-10-CM | POA: Diagnosis not present

## 2020-09-15 DIAGNOSIS — R7303 Prediabetes: Secondary | ICD-10-CM | POA: Diagnosis not present

## 2020-09-15 DIAGNOSIS — G4730 Sleep apnea, unspecified: Secondary | ICD-10-CM | POA: Diagnosis not present

## 2020-09-15 DIAGNOSIS — Z85850 Personal history of malignant neoplasm of thyroid: Secondary | ICD-10-CM | POA: Diagnosis not present

## 2020-09-15 DIAGNOSIS — Z713 Dietary counseling and surveillance: Secondary | ICD-10-CM | POA: Diagnosis not present

## 2020-09-21 DIAGNOSIS — K080 Exfoliation of teeth due to systemic causes: Secondary | ICD-10-CM | POA: Diagnosis not present

## 2020-10-08 ENCOUNTER — Encounter: Payer: Self-pay | Admitting: Dietician

## 2020-10-08 ENCOUNTER — Other Ambulatory Visit: Payer: Self-pay

## 2020-10-08 ENCOUNTER — Encounter: Payer: Federal, State, Local not specified - PPO | Attending: General Surgery | Admitting: Dietician

## 2020-10-08 DIAGNOSIS — Z13220 Encounter for screening for lipoid disorders: Secondary | ICD-10-CM | POA: Diagnosis not present

## 2020-10-08 DIAGNOSIS — Z0000 Encounter for general adult medical examination without abnormal findings: Secondary | ICD-10-CM | POA: Diagnosis not present

## 2020-10-08 DIAGNOSIS — E669 Obesity, unspecified: Secondary | ICD-10-CM | POA: Insufficient documentation

## 2020-10-08 DIAGNOSIS — E282 Polycystic ovarian syndrome: Secondary | ICD-10-CM | POA: Diagnosis not present

## 2020-10-08 NOTE — Patient Instructions (Addendum)
It was nice meeting you today! Begin working through the [employer] discussed, starting with the following:   Track food and beverage intake (pen and paper, MyFitness Pal, Baritastic app, etc.)  Make healthy food choices while monitoring portion sizes  Practice CHEWING your food (aim for applesauce consistency)  See you soon for your 1st of 3 Supervised visits! Please email in the meantime with questions/concerns.

## 2020-10-08 NOTE — Progress Notes (Signed)
Nutrition Assessment for Bariatric Surgery Medical Nutrition Therapy   Patient was seen on 10/08/2020 for Pre-Operative Nutrition Assessment. Letter of approval faxed to [facility] Surgery bariatric surgery program coordinator on 10/08/2020.   Referral stated Supervised Weight Loss (SWL) visits needed: 3  Planned surgery: RYGB Pt expectation of surgery: to help have a better lifestyle, lose weight to avoid more hip pain/issues, be more physically active   NUTRITION ASSESSMENT   Anthropometrics  Start weight at [facility]: 260.2 lbs (date: 10/08/2020) Height: 65 in BMI: 43.3 kg/m2     Lifestyle & Dietary Hx States weight loss is challenging for her due to recent hip replacement and thyroid disease. States her weight gain began in 1990s (in her 28s) until 1998 when she switched to healthier eating habits and followed a vegetarian diet. States she lost ~158lbs over 4 years and weighed around 168 lbs in 2002. States that she was diagnosed with cancer in 2009 which caused her to switch up her diet and in 2018 she began to gain weight again after a family member passed.  States she is very structured in what she eats, avoids sweets mostly but may have chocolate. May have a Chipotle bowl, Cracker Barrel salad, hard boiled eggs with [place] bacon, Premier Protein shake, [place] cheese pickle roll up, [place] with vegetables and sweet potatoes, enchiladas, or fish with vegetables and brown rice. Overall eats very well and balanced, and states she pays attention to how food makes her feel. State she is working on learning more about this, especially taking into consideration her thyroid condition and PCOS. States she was recently diagnosed with PCOS and requires taking vitamin B12 with her current medication to help with PCOS and prediabetes.   Any previous deficiencies: vitamin D, vitamin B12  Micronutrient Nutrition Focused Physical Exam: Hair: no issues observed Eyes: no issues observed Mouth: no  issues observed Neck: no issues observed Nails: no issues observed Skin: no issues observed  24-Hr Dietary Recall First Meal: 1 packet low sugar oatmeal + yogurt Snack: - Second Meal: - Snack: tuna + crackers  Third Meal: portioned frozen meal Snack: - Beverages: water, sometimes Ice sparkling water   NUTRITION DIAGNOSIS  Overweight/obesity ([place]-3.3) related to past poor dietary habits and physical inactivity as evidenced by patient w/ planned RYGB surgery following dietary guidelines for continued weight loss.    NUTRITION INTERVENTION  Nutrition counseling (C-1) and education (E-2) to facilitate bariatric surgery goals.  Pre-Op Goals Reviewed with the Patient . Track food and beverage intake (pen and paper, MyFitness Pal, Baritastic app, etc.) . Make healthy food choices while monitoring portion sizes . Consume 3 meals per day or try to eat every 3-5 hours . Avoid concentrated sugars and fried foods . Keep sugar & fat in the single digits per serving on food labels . Practice CHEWING your food (aim for applesauce consistency) . Practice not drinking 15 minutes before, during, and 30 minutes after each meal and snack . Avoid all carbonated beverages (ex: soda, sparkling beverages)  . Limit caffeinated beverages (ex: coffee, tea, energy drinks) . Avoid all sugar-sweetened beverages (ex: regular soda, sports drinks)  . Avoid alcohol  . Aim for 64-100 ounces of FLUID daily (with at least half of fluid intake being plain water)  . Aim for at least 60-80 grams of PROTEIN daily . Look for a liquid protein source that contains ?15 g protein and ?5 g carbohydrate (ex: shakes, drinks, shots) . Make a list of non-food related activities . Physical activity  is an important part of a healthy lifestyle so keep it moving! The goal is to reach 150 minutes of exercise per week, including cardiovascular and weight baring activity.  *Goals that are bolded indicate the pt would like to start  working towards these  Handouts Provided Include  . Bariatric Surgery Nutrition Visits . Pre-Op Goals  Learning Style & Readiness for Change Teaching method utilized: Visual & Auditory  Demonstrated degree of understanding via: Teach Back  Barriers to learning/adherence to lifestyle change: None Identified    MONITORING & EVALUATION Dietary intake, weekly physical activity, body weight, and pre-op goals reached at next nutrition visit.   Next Steps Patient is to return to [facility] in 1 month for 1st SWL Visit.

## 2020-10-10 DIAGNOSIS — F509 Eating disorder, unspecified: Secondary | ICD-10-CM | POA: Diagnosis not present

## 2020-11-03 ENCOUNTER — Other Ambulatory Visit: Payer: Self-pay

## 2020-11-03 ENCOUNTER — Encounter: Payer: Federal, State, Local not specified - PPO | Attending: General Surgery | Admitting: Dietician

## 2020-11-03 VITALS — Wt 261.0 lb

## 2020-11-03 DIAGNOSIS — E669 Obesity, unspecified: Secondary | ICD-10-CM | POA: Insufficient documentation

## 2020-11-03 NOTE — Progress Notes (Unsigned)
Supervised Weight Loss Visit Bariatric Nutrition Education  Planned Surgery: RYGB  Pt Expectation of Surgery/ Goals: to help have a better lifestyle, lose weight to avoid more hip pain/issues, be more physically active  1 out of 3 SWL Appointments    NUTRITION ASSESSMENT  Anthropometrics  Start weight at [facility]: 260.2 lbs (date: 10/08/2020) Today's weight: 261 lbs BMI: 43.4 kg/m2    Lifestyle & Dietary Hx Patient states she is walking more and is up to walking at least 15 minutes daily. States she knows she needs to work on her portion sizes, such as having smaller breakfast and lunch meals. States lately she has gotten into the habit of combining breakfast and lunch into one large meal, and therefore does not feel hungry until close to dinnertime and eventually feels starving later in the day. May have low calorie oatmeal and [place] bacon, Healthy Choice chicken and broccoli dish, or Good Food chicken enchilada dish. States she tried tracking her food intake using MyFitness Pal which was challenging for her. States she has preconceived ideas that starches are bad for her and that she needs to limit her daily carbohydrate intake.   24-Hr Dietary Recall First Meal: Premier Protein  Snack: - Second Meal: salad + tuna packet  Snack: - Third Meal: homemade chili  Snack: - Beverages: water, sparkling Ice water   NUTRITION DIAGNOSIS  Overweight/obesity ([place]-3.3) related to past poor dietary habits and physical inactivity as evidenced by patient w/ planned RYGB surgery following dietary guidelines for continued weight loss.   NUTRITION INTERVENTION  Nutrition counseling (C-1) and education (E-2) to facilitate bariatric surgery goals.  Pre-Op Goals Progress & New Goals . Continue to work towards eating 3 times per day, this will help with portion control as well (3 smaller meals vs 2 larger meals)  . Continue staying active throughout the week! . Continue to work on chewing each bite of  food thoroughly . NEW: Utilize the MyPlate handout to guide your meals... it is appropriate to include starches/ carbohydrates, have them take up about 1/4 of your meal (see Meal Ideas sheet for good sources)   Handouts Provided Include   MyPlate   Meal Ideas   Learning Style & Readiness for Change Teaching method utilized: Visual & Auditory  Demonstrated degree of understanding via: Teach Back  Barriers to learning/adherence to lifestyle change: None Identified    MONITORING & EVALUATION Dietary intake, weekly physical activity, body weight, and pre-op goals in 1 month.   Next Steps  Patient is to return to [facility] in 1 month for 2nd SWL.

## 2020-11-04 ENCOUNTER — Encounter: Payer: Self-pay | Admitting: Dietician

## 2020-11-04 NOTE — Patient Instructions (Signed)
   Continue to work towards eating 3 times per day, this will help with portion control as well (3 smaller meals vs 2 larger meals)   Continue staying active throughout the week!  Continue to work on chewing each bite of food thoroughly  NEW: Utilize the MyPlate handout to guide your meals... it is appropriate to include starches/ carbohydrates, have them take up about 1/4 of your meal (see Meal Ideas sheet for good sources)

## 2020-11-05 DIAGNOSIS — Z713 Dietary counseling and surveillance: Secondary | ICD-10-CM | POA: Diagnosis not present

## 2020-11-16 DIAGNOSIS — L680 Hirsutism: Secondary | ICD-10-CM | POA: Diagnosis not present

## 2020-11-16 DIAGNOSIS — L700 Acne vulgaris: Secondary | ICD-10-CM | POA: Diagnosis not present

## 2020-11-26 ENCOUNTER — Other Ambulatory Visit: Payer: Self-pay | Admitting: General Surgery

## 2020-11-26 ENCOUNTER — Other Ambulatory Visit (HOSPITAL_COMMUNITY): Payer: Self-pay | Admitting: General Surgery

## 2020-12-01 DIAGNOSIS — J069 Acute upper respiratory infection, unspecified: Secondary | ICD-10-CM | POA: Diagnosis not present

## 2020-12-03 ENCOUNTER — Ambulatory Visit: Payer: Federal, State, Local not specified - PPO | Admitting: Skilled Nursing Facility1

## 2020-12-03 DIAGNOSIS — J069 Acute upper respiratory infection, unspecified: Secondary | ICD-10-CM | POA: Diagnosis not present

## 2020-12-03 DIAGNOSIS — Z20822 Contact with and (suspected) exposure to covid-19: Secondary | ICD-10-CM | POA: Diagnosis not present

## 2020-12-08 DIAGNOSIS — Z713 Dietary counseling and surveillance: Secondary | ICD-10-CM | POA: Diagnosis not present

## 2020-12-10 ENCOUNTER — Other Ambulatory Visit: Payer: Self-pay

## 2020-12-10 ENCOUNTER — Ambulatory Visit (HOSPITAL_COMMUNITY)
Admission: RE | Admit: 2020-12-10 | Discharge: 2020-12-10 | Disposition: A | Discharge status: Home / Self Care | Payer: Federal, State, Local not specified - PPO | Source: Ambulatory Visit | Attending: General Surgery | Admitting: General Surgery

## 2020-12-10 DIAGNOSIS — Z01818 Encounter for other preprocedural examination: Secondary | ICD-10-CM | POA: Diagnosis not present

## 2020-12-12 DIAGNOSIS — F509 Eating disorder, unspecified: Secondary | ICD-10-CM | POA: Diagnosis not present

## 2020-12-31 ENCOUNTER — Ambulatory Visit: Payer: Federal, State, Local not specified - PPO | Admitting: Skilled Nursing Facility1

## 2021-01-04 ENCOUNTER — Other Ambulatory Visit: Payer: Self-pay

## 2021-01-04 ENCOUNTER — Encounter: Payer: Federal, State, Local not specified - PPO | Attending: General Surgery | Admitting: Skilled Nursing Facility1

## 2021-01-04 DIAGNOSIS — E669 Obesity, unspecified: Secondary | ICD-10-CM | POA: Insufficient documentation

## 2021-01-04 DIAGNOSIS — Z713 Dietary counseling and surveillance: Secondary | ICD-10-CM | POA: Diagnosis not present

## 2021-01-04 NOTE — Progress Notes (Signed)
Pre-Operative Nutrition Class:  Appt start time: 1225   End time:  1830.  Patient was seen on 01/04/2021 for Pre-Operative Bariatric Surgery Education [facility]    Class conducted virtually. Pt identified by name and DOB. Pt verbally agrees to the limitations of this visit type.   Start weight at [facility]: 260.2 lbs (date: 10/08/2020) BMI: 43.4 kg/m2    The following the learning objectives were met by the patient during this course:  Identify Pre-Op Dietary Goals and will begin 2 weeks pre-operatively  Identify appropriate sources of fluids and proteins   State protein recommendations and appropriate sources pre and post-operatively  Identify Post-Operative Dietary Goals and will follow for 2 weeks post-operatively  Identify appropriate multivitamin and calcium sources  Describe the need for physical activity post-operatively and will follow MD recommendations  State when to call healthcare provider regarding medication questions or post-operative complications  Handouts given during class include:  Pre-Op Bariatric Surgery Diet Handout  Protein Shake Handout  Post-Op Bariatric Surgery Nutrition Handout  [facility] Program Information Flyer  Support Group Information Flyer  [facility] Bariatric Supplements Price List  Follow-Up Plan: Patient will follow-up at [facility] 2 weeks post operatively for diet advancement per MD.

## 2021-01-12 DIAGNOSIS — Z85850 Personal history of malignant neoplasm of thyroid: Secondary | ICD-10-CM | POA: Diagnosis not present

## 2021-01-12 DIAGNOSIS — Z86010 Personal history of colonic polyps: Secondary | ICD-10-CM | POA: Diagnosis not present

## 2021-01-12 DIAGNOSIS — Z8049 Family history of malignant neoplasm of other genital organs: Secondary | ICD-10-CM | POA: Diagnosis not present

## 2021-01-12 DIAGNOSIS — Z800 Family history of malignant neoplasm of digestive organs: Secondary | ICD-10-CM | POA: Diagnosis not present

## 2021-01-13 ENCOUNTER — Ambulatory Visit: Payer: Self-pay | Admitting: General Surgery

## 2021-01-13 NOTE — Progress Notes (Signed)
DUE TO COVID-19 ONLY ONE VISITOR IS ALLOWED TO COME WITH YOU AND STAY IN THE WAITING ROOM ONLY DURING PRE OP AND PROCEDURE DAY OF SURGERY. THE 1 VISITOR  MAY VISIT WITH YOU AFTER SURGERY IN YOUR PRIVATE ROOM DURING VISITING HOURS ONLY!  YOU NEED TO HAVE A COVID 19 TEST ON____2/25/2022 ___ @_______ , THIS TEST MUST BE DONE BEFORE SURGERY,  COVID TESTING SITE [address] [place] [address], IT IS ON THE RIGHT GOING OUT [address] APPROXIMATELY  2 MINUTES PAST [employer] ON THE RIGHT. ONCE YOUR COVID TEST IS COMPLETED,  PLEASE BEGIN THE QUARANTINE INSTRUCTIONS AS OUTLINED IN YOUR HANDOUT.                Erika Lambert  01/13/2021   Your procedure is scheduled on: 01/26/2021    Report to [facility] Main  Entrance   Report to admitting at     1130 AM     Call this number if you have problems the morning of surgery [phone]    REMEMBER: NO  SOLID FOOD CANDY OR GUM AFTER MIDNIGHT. CLEAR LIQUIDS UNTIL 1030 am        . NOTHING BY MOUTH EXCEPT CLEAR LIQUIDS UNTIL    . PLEASE FINISH ENSURE DRINK PER SURGEON ORDER  WHICH NEEDS TO BE COMPLETED AT  1030am     CLEAR LIQUID DIET   Foods Allowed                                                                    Coffee and tea, regular and decaf                            Fruit ices (not with fruit pulp)                                      Iced Popsicles                                    Carbonated beverages, regular and diet                                    Cranberry, grape and apple juices Sports drinks like Gatorade Lightly seasoned clear broth or consume(fat free) Sugar, honey syrup ___________________________________________________________________      BRUSH YOUR TEETH MORNING OF SURGERY AND RINSE YOUR MOUTH OUT, NO CHEWING GUM CANDY OR MINTS.     Take these medicines the morning of surgery with A SIP OF WATER: synthroid  DO NOT TAKE ANY DIABETIC MEDICATIONS DAY OF YOUR SURGERY                                You may not have any metal on your body including hair pins and              piercings  Do not wear jewelry, make-up, lotions, powders or  perfumes, deodorant             Do not wear nail polish on your fingernails.  Do not shave  48 hours prior to surgery.              Men may shave face and neck.   Do not bring valuables to the hospital. [place].  Contacts, dentures or bridgework may not be worn into surgery.  Leave suitcase in the car. After surgery it may be brought to your room.     Patients discharged the day of surgery will not be allowed to drive home. IF YOU ARE HAVING SURGERY AND GOING HOME THE SAME DAY, YOU MUST HAVE AN ADULT TO DRIVE YOU HOME AND BE WITH YOU FOR 24 HOURS. YOU MAY GO HOME BY TAXI OR UBER OR ORTHERWISE, BUT AN ADULT MUST ACCOMPANY YOU HOME AND STAY WITH YOU FOR 24 HOURS.  Name and phone number of your driver:  Special Instructions: N/A              Please read over the following fact sheets you were given: _____________________________________________________________________  [facility] - Preparing for Surgery Before surgery, you can play an important role.  Because skin is not sterile, your skin needs to be as free of germs as possible.  You can reduce the number of germs on your skin by washing with CHG (chlorahexidine gluconate) soap before surgery.  CHG is an antiseptic cleaner which kills germs and bonds with the skin to continue killing germs even after washing. Please DO NOT use if you have an allergy to CHG or antibacterial soaps.  If your skin becomes reddened/irritated stop using the CHG and inform your nurse when you arrive at Short Stay. Do not shave (including legs and underarms) for at least 48 hours prior to the first CHG shower.  You may shave your face/neck. Please follow these instructions carefully:  1.  Shower with CHG Soap the night before surgery and the  morning of Surgery.  2.   If you choose to wash your hair, wash your hair first as usual with your  normal  shampoo.  3.  After you shampoo, rinse your hair and body thoroughly to remove the  shampoo.                           4.  Use CHG as you would any other liquid soap.  You can apply chg directly  to the skin and wash                       Gently with a scrungie or clean washcloth.  5.  Apply the CHG Soap to your body ONLY FROM THE NECK DOWN.   Do not use on face/ open                           Wound or open sores. Avoid contact with eyes, ears mouth and genitals (private parts).                       Wash face,  Genitals (private parts) with your normal soap.             6.  Wash thoroughly, paying special attention to the area where  your surgery  will be performed.  7.  Thoroughly rinse your body with warm water from the neck down.  8.  DO NOT shower/wash with your normal soap after using and rinsing off  the CHG Soap.                9.  Pat yourself dry with a clean towel.            10.  Wear clean pajamas.            11.  Place clean sheets on your bed the night of your first shower and do not  sleep with pets. Day of Surgery : Do not apply any lotions/deodorants the morning of surgery.  Please wear clean clothes to the hospital/surgery center.  FAILURE TO FOLLOW THESE INSTRUCTIONS MAY RESULT IN THE CANCELLATION OF YOUR SURGERY PATIENT SIGNATURE_________________________________  NURSE SIGNATURE__________________________________  ________________________________________________________________________

## 2021-01-14 ENCOUNTER — Encounter (HOSPITAL_COMMUNITY): Payer: Self-pay

## 2021-01-14 ENCOUNTER — Other Ambulatory Visit: Payer: Self-pay

## 2021-01-14 ENCOUNTER — Encounter (HOSPITAL_COMMUNITY)
Admission: RE | Admit: 2021-01-14 | Discharge: 2021-01-14 | Disposition: A | Discharge status: Home / Self Care | Payer: Federal, State, Local not specified - PPO | Source: Ambulatory Visit | Attending: General Surgery | Admitting: General Surgery

## 2021-01-14 DIAGNOSIS — Z01812 Encounter for preprocedural laboratory examination: Secondary | ICD-10-CM | POA: Diagnosis not present

## 2021-01-14 HISTORY — DX: Anxiety disorder, unspecified: F41.9

## 2021-01-14 HISTORY — DX: Unspecified osteoarthritis, unspecified site: M19.90

## 2021-01-14 HISTORY — DX: Hypothyroidism, unspecified: E03.9

## 2021-01-14 HISTORY — DX: Headache, unspecified: R51.9

## 2021-01-14 HISTORY — DX: Sleep apnea, unspecified: G47.30

## 2021-01-14 HISTORY — DX: Gastro-esophageal reflux disease without esophagitis: K21.9

## 2021-01-14 HISTORY — DX: Pneumonia, unspecified organism: J18.9

## 2021-01-14 LAB — CBC WITH DIFFERENTIAL/PLATELET
Abs Immature Granulocytes: 0.02 10*3/uL (ref 0.00–0.07)
Basophils Absolute: 0.1 10*3/uL (ref 0.0–0.1)
Basophils Relative: 1 %
Eosinophils Absolute: 0.1 10*3/uL (ref 0.0–0.5)
Eosinophils Relative: 1 %
HCT: 43.7 % (ref 36.0–46.0)
Hemoglobin: 14.5 g/dL (ref 12.0–15.0)
Immature Granulocytes: 0 %
Lymphocytes Relative: 27 %
Lymphs Abs: 2.1 10*3/uL (ref 0.7–4.0)
MCH: 30.5 pg (ref 26.0–34.0)
MCHC: 33.2 g/dL (ref 30.0–36.0)
MCV: 91.8 fL (ref 80.0–100.0)
Monocytes Absolute: 0.6 10*3/uL (ref 0.1–1.0)
Monocytes Relative: 8 %
Neutro Abs: 4.9 10*3/uL (ref 1.7–7.7)
Neutrophils Relative %: 63 %
Platelets: 257 10*3/uL (ref 150–400)
RBC: 4.76 MIL/uL (ref 3.87–5.11)
RDW: 13.4 % (ref 11.5–15.5)
WBC: 7.8 10*3/uL (ref 4.0–10.5)
nRBC: 0 % (ref 0.0–0.2)

## 2021-01-14 LAB — COMPREHENSIVE METABOLIC PANEL
ALT: 14 U/L (ref 0–44)
AST: 27 U/L (ref 15–41)
Albumin: 4.4 g/dL (ref 3.5–5.0)
Alkaline Phosphatase: 75 U/L (ref 38–126)
Anion gap: 9 (ref 5–15)
BUN: 20 mg/dL (ref 6–20)
CO2: 26 mmol/L (ref 22–32)
Calcium: 9.4 mg/dL (ref 8.9–10.3)
Chloride: 100 mmol/L (ref 98–111)
Creatinine, Ser: 0.77 mg/dL (ref 0.44–1.00)
GFR, Estimated: >60 mL/min (ref >60)
Glucose, Bld: 94 mg/dL (ref 70–99)
Potassium: 4.1 mmol/L (ref 3.5–5.1)
Sodium: 135 mmol/L (ref 135–145)
Total Bilirubin: 0.8 mg/dL (ref 0.3–1.2)
Total Protein: 7.9 g/dL (ref 6.5–8.1)

## 2021-01-14 LAB — TYPE AND SCREEN
ABO/RH(D): B POS
Antibody Screen: NEGATIVE

## 2021-01-14 LAB — HEMOGLOBIN A1C
Hgb A1c MFr Bld: 6.1 % — ABNORMAL HIGH (ref 4.8–5.6)
Mean Plasma Glucose: 128.37 mg/dL

## 2021-01-14 LAB — GLUCOSE, CAPILLARY: Glucose-Capillary: 95 mg/dL (ref 70–99)

## 2021-01-14 NOTE — Progress Notes (Addendum)
Anesthesia Review:  PCP: DR K. Timberlake with [facility] requested LOV note on 01/14/21 - received on 01/15/21 dated 03/26/2020  Saw for clearance  Cardiologist : Chest x-ray : EKG : 12/10/2020  Echo : Stress test: Cardiac Cath :  Activity level:  Sleep Study/ CPAP : Fasting Blood Sugar :      / Checks Blood Sugar -- times a day:   Blood Thinner/ Instructions /Last Dose: ASA / Instructions/ Last Dose :  Prediabetes per pt  HGBa1c-01/14/2021 6.1  Pt does not check glucose at home

## 2021-01-15 DIAGNOSIS — G4730 Sleep apnea, unspecified: Secondary | ICD-10-CM | POA: Diagnosis not present

## 2021-01-16 DIAGNOSIS — Z1231 Encounter for screening mammogram for malignant neoplasm of breast: Secondary | ICD-10-CM | POA: Diagnosis not present

## 2021-01-22 ENCOUNTER — Other Ambulatory Visit (HOSPITAL_COMMUNITY)
Admission: RE | Admit: 2021-01-22 | Discharge: 2021-01-22 | Disposition: A | Discharge status: Home / Self Care | Payer: Federal, State, Local not specified - PPO | Source: Ambulatory Visit | Attending: General Surgery | Admitting: General Surgery

## 2021-01-22 DIAGNOSIS — Z20822 Contact with and (suspected) exposure to covid-19: Secondary | ICD-10-CM | POA: Insufficient documentation

## 2021-01-22 DIAGNOSIS — Z01812 Encounter for preprocedural laboratory examination: Secondary | ICD-10-CM | POA: Insufficient documentation

## 2021-01-22 LAB — SARS CORONAVIRUS 2 (TAT 6-24 HRS): SARS Coronavirus 2: NEGATIVE

## 2021-01-25 MED ORDER — BUPIVACAINE LIPOSOME 1.3 % IJ SUSP
20.0000 mL | Freq: Once | INTRAMUSCULAR | Status: DC
  Filled 2021-01-25: qty 20

## 2021-01-26 ENCOUNTER — Inpatient Hospital Stay (HOSPITAL_COMMUNITY)
Admission: RE | Admit: 2021-01-26 | Discharge: 2021-01-28 | DRG: 621 | Disposition: A | Discharge status: Home / Self Care | Payer: Federal, State, Local not specified - PPO | Attending: General Surgery | Admitting: General Surgery

## 2021-01-26 ENCOUNTER — Other Ambulatory Visit: Payer: Self-pay

## 2021-01-26 ENCOUNTER — Inpatient Hospital Stay (HOSPITAL_COMMUNITY): Payer: Federal, State, Local not specified - PPO | Admitting: Physician Assistant

## 2021-01-26 ENCOUNTER — Inpatient Hospital Stay (HOSPITAL_COMMUNITY): Payer: Federal, State, Local not specified - PPO | Admitting: Certified Registered Nurse Anesthetist

## 2021-01-26 ENCOUNTER — Encounter (HOSPITAL_COMMUNITY): Payer: Self-pay | Admitting: General Surgery

## 2021-01-26 ENCOUNTER — Encounter (HOSPITAL_COMMUNITY)
Admission: RE | Disposition: A | Discharge status: Home / Self Care | Payer: Self-pay | Source: Home / Self Care | Attending: General Surgery

## 2021-01-26 DIAGNOSIS — Z96641 Presence of right artificial hip joint: Secondary | ICD-10-CM | POA: Diagnosis present

## 2021-01-26 DIAGNOSIS — G4730 Sleep apnea, unspecified: Secondary | ICD-10-CM | POA: Diagnosis not present

## 2021-01-26 DIAGNOSIS — E039 Hypothyroidism, unspecified: Secondary | ICD-10-CM | POA: Diagnosis present

## 2021-01-26 DIAGNOSIS — F419 Anxiety disorder, unspecified: Secondary | ICD-10-CM | POA: Diagnosis not present

## 2021-01-26 DIAGNOSIS — Z91040 Latex allergy status: Secondary | ICD-10-CM | POA: Diagnosis not present

## 2021-01-26 DIAGNOSIS — Z79899 Other long term (current) drug therapy: Secondary | ICD-10-CM

## 2021-01-26 DIAGNOSIS — Z880 Allergy status to penicillin: Secondary | ICD-10-CM

## 2021-01-26 DIAGNOSIS — Z79890 Hormone replacement therapy (postmenopausal): Secondary | ICD-10-CM

## 2021-01-26 DIAGNOSIS — K219 Gastro-esophageal reflux disease without esophagitis: Secondary | ICD-10-CM | POA: Diagnosis not present

## 2021-01-26 DIAGNOSIS — Z6841 Body Mass Index (BMI) 40.0 and over, adult: Secondary | ICD-10-CM

## 2021-01-26 DIAGNOSIS — Z888 Allergy status to other drugs, medicaments and biological substances status: Secondary | ICD-10-CM | POA: Diagnosis not present

## 2021-01-26 DIAGNOSIS — D72829 Elevated white blood cell count, unspecified: Secondary | ICD-10-CM | POA: Diagnosis not present

## 2021-01-26 DIAGNOSIS — J45909 Unspecified asthma, uncomplicated: Secondary | ICD-10-CM | POA: Diagnosis present

## 2021-01-26 HISTORY — PX: GASTRIC ROUX-EN-Y: SHX5262

## 2021-01-26 HISTORY — PX: UPPER GI ENDOSCOPY: SHX6162

## 2021-01-26 LAB — HEMOGLOBIN AND HEMATOCRIT, BLOOD
HCT: 42.8 % (ref 36.0–46.0)
Hemoglobin: 13.8 g/dL (ref 12.0–15.0)

## 2021-01-26 LAB — GLUCOSE, CAPILLARY: Glucose-Capillary: 105 mg/dL — ABNORMAL HIGH (ref 70–99)

## 2021-01-26 SURGERY — LAPAROSCOPIC ROUX-EN-Y GASTRIC BYPASS WITH UPPER ENDOSCOPY
Anesthesia: General | Site: Esophagus

## 2021-01-26 MED ORDER — HEPARIN SODIUM (PORCINE) 5000 UNIT/ML IJ SOLN
5000.0000 [IU] | INTRAMUSCULAR | Status: AC
  Administered 2021-01-26: 5000 [IU] via SUBCUTANEOUS
  Filled 2021-01-26: qty 1

## 2021-01-26 MED ORDER — 0.9 % SODIUM CHLORIDE (POUR BTL) OPTIME
TOPICAL | Status: DC | PRN
  Administered 2021-01-26: 1000 mL

## 2021-01-26 MED ORDER — PROPOFOL 10 MG/ML IV BOLUS
INTRAVENOUS | Status: DC | PRN
  Administered 2021-01-26: 200 mg via INTRAVENOUS

## 2021-01-26 MED ORDER — HYDRALAZINE HCL 20 MG/ML IJ SOLN
INTRAMUSCULAR | Status: AC
  Filled 2021-01-26: qty 1

## 2021-01-26 MED ORDER — GABAPENTIN 100 MG PO CAPS
200.0000 mg | ORAL_CAPSULE | Freq: Two times a day (BID) | ORAL | Status: DC
  Administered 2021-01-26 – 2021-01-28 (×4): 200 mg via ORAL
  Filled 2021-01-26 (×5): qty 2

## 2021-01-26 MED ORDER — PROPOFOL 10 MG/ML IV BOLUS
INTRAVENOUS | Status: AC
  Filled 2021-01-26: qty 20

## 2021-01-26 MED ORDER — OXYCODONE HCL 5 MG/5ML PO SOLN
5.0000 mg | Freq: Four times a day (QID) | ORAL | Status: DC | PRN
  Administered 2021-01-26 – 2021-01-27 (×3): 5 mg via ORAL
  Filled 2021-01-26 (×3): qty 5

## 2021-01-26 MED ORDER — OXYCODONE HCL 5 MG PO TABS
5.0000 mg | ORAL_TABLET | Freq: Once | ORAL | Status: AC | PRN
  Administered 2021-01-26: 5 mg via ORAL

## 2021-01-26 MED ORDER — LIDOCAINE 2% (20 MG/ML) 5 ML SYRINGE
INTRAMUSCULAR | Status: DC | PRN
  Administered 2021-01-26: 100 mg via INTRAVENOUS

## 2021-01-26 MED ORDER — BUPIVACAINE HCL 0.25 % IJ SOLN
INTRAMUSCULAR | Status: AC
  Filled 2021-01-26: qty 1

## 2021-01-26 MED ORDER — LIDOCAINE HCL (PF) 2 % IJ SOLN
INTRAMUSCULAR | Status: DC | PRN
  Administered 2021-01-26: 1.5 mg/kg/h via INTRADERMAL

## 2021-01-26 MED ORDER — ONDANSETRON HCL 4 MG/2ML IJ SOLN
INTRAMUSCULAR | Status: AC
  Filled 2021-01-26: qty 2

## 2021-01-26 MED ORDER — ENSURE MAX PROTEIN PO LIQD
2.0000 [oz_av] | ORAL | Status: DC
  Administered 2021-01-27 – 2021-01-28 (×14): 2 [oz_av] via ORAL

## 2021-01-26 MED ORDER — SCOPOLAMINE 1 MG/3DAYS TD PT72
1.0000 | MEDICATED_PATCH | TRANSDERMAL | Status: DC
  Administered 2021-01-26: 1.5 mg via TRANSDERMAL
  Filled 2021-01-26: qty 1

## 2021-01-26 MED ORDER — APREPITANT 40 MG PO CAPS
40.0000 mg | ORAL_CAPSULE | ORAL | Status: AC
  Administered 2021-01-26: 40 mg via ORAL
  Filled 2021-01-26: qty 1

## 2021-01-26 MED ORDER — FENTANYL CITRATE (PF) 100 MCG/2ML IJ SOLN
INTRAMUSCULAR | Status: DC | PRN
  Administered 2021-01-26 (×3): 50 ug via INTRAVENOUS
  Administered 2021-01-26: 100 ug via INTRAVENOUS

## 2021-01-26 MED ORDER — ROCURONIUM BROMIDE 10 MG/ML (PF) SYRINGE
PREFILLED_SYRINGE | INTRAVENOUS | Status: DC | PRN
  Administered 2021-01-26: 20 mg via INTRAVENOUS
  Administered 2021-01-26: 80 mg via INTRAVENOUS

## 2021-01-26 MED ORDER — MIDAZOLAM HCL 5 MG/5ML IJ SOLN
INTRAMUSCULAR | Status: DC | PRN
  Administered 2021-01-26: 2 mg via INTRAVENOUS

## 2021-01-26 MED ORDER — DEXAMETHASONE SODIUM PHOSPHATE 4 MG/ML IJ SOLN
INTRAMUSCULAR | Status: DC | PRN
  Administered 2021-01-26: 10 mg via INTRAVENOUS

## 2021-01-26 MED ORDER — BUPIVACAINE LIPOSOME 1.3 % IJ SUSP
INTRAMUSCULAR | Status: DC | PRN
  Administered 2021-01-26: 20 mL

## 2021-01-26 MED ORDER — FENTANYL CITRATE (PF) 250 MCG/5ML IJ SOLN
INTRAMUSCULAR | Status: AC
  Filled 2021-01-26: qty 5

## 2021-01-26 MED ORDER — CHLORHEXIDINE GLUCONATE CLOTH 2 % EX PADS: 6.0000 | MEDICATED_PAD | Freq: Once | CUTANEOUS | Status: DC

## 2021-01-26 MED ORDER — BUPIVACAINE HCL 0.25 % IJ SOLN
INTRAMUSCULAR | Status: DC | PRN
  Administered 2021-01-26: 30 mL

## 2021-01-26 MED ORDER — ACETAMINOPHEN 10 MG/ML IV SOLN
INTRAVENOUS | Status: AC
  Filled 2021-01-26: qty 100

## 2021-01-26 MED ORDER — ENOXAPARIN SODIUM 30 MG/0.3ML ~~LOC~~ SOLN
30.0000 mg | Freq: Two times a day (BID) | SUBCUTANEOUS | Status: DC
  Administered 2021-01-26 – 2021-01-28 (×4): 30 mg via SUBCUTANEOUS
  Filled 2021-01-26 (×4): qty 0.3

## 2021-01-26 MED ORDER — GABAPENTIN 300 MG PO CAPS
300.0000 mg | ORAL_CAPSULE | ORAL | Status: AC
  Administered 2021-01-26: 300 mg via ORAL
  Filled 2021-01-26: qty 1

## 2021-01-26 MED ORDER — LACTATED RINGERS IV SOLN: INTRAVENOUS | Status: DC

## 2021-01-26 MED ORDER — FENTANYL CITRATE (PF) 100 MCG/2ML IJ SOLN
INTRAMUSCULAR | Status: AC
  Filled 2021-01-26: qty 2

## 2021-01-26 MED ORDER — PHENYLEPHRINE 40 MCG/ML (10ML) SYRINGE FOR IV PUSH (FOR BLOOD PRESSURE SUPPORT)
PREFILLED_SYRINGE | INTRAVENOUS | Status: AC
  Filled 2021-01-26: qty 10

## 2021-01-26 MED ORDER — DEXAMETHASONE SODIUM PHOSPHATE 10 MG/ML IJ SOLN
INTRAMUSCULAR | Status: AC
  Filled 2021-01-26: qty 1

## 2021-01-26 MED ORDER — ORAL CARE MOUTH RINSE: 15.0000 mL | Freq: Once | OROMUCOSAL | Status: AC

## 2021-01-26 MED ORDER — HYDROMORPHONE HCL 1 MG/ML IJ SOLN
INTRAMUSCULAR | Status: AC
  Filled 2021-01-26: qty 1

## 2021-01-26 MED ORDER — ONDANSETRON HCL 4 MG/2ML IJ SOLN
INTRAMUSCULAR | Status: DC | PRN
Start: 2021-01-26 — End: 2021-01-26
  Administered 2021-01-26: 4 mg via INTRAVENOUS

## 2021-01-26 MED ORDER — MORPHINE SULFATE (PF) 4 MG/ML IV SOLN: 1.0000 mg | INTRAVENOUS | Status: DC | PRN

## 2021-01-26 MED ORDER — ACETAMINOPHEN 500 MG PO TABS
1000.0000 mg | ORAL_TABLET | Freq: Three times a day (TID) | ORAL | Status: DC
  Administered 2021-01-26 – 2021-01-28 (×5): 1000 mg via ORAL
  Filled 2021-01-26 (×5): qty 2

## 2021-01-26 MED ORDER — KETAMINE HCL 10 MG/ML IJ SOLN
INTRAMUSCULAR | Status: DC | PRN
  Administered 2021-01-26: 30 mg via INTRAVENOUS

## 2021-01-26 MED ORDER — ONDANSETRON HCL 4 MG/2ML IJ SOLN
4.0000 mg | INTRAMUSCULAR | Status: DC | PRN
  Filled 2021-01-26: qty 2

## 2021-01-26 MED ORDER — PHENYLEPHRINE 40 MCG/ML (10ML) SYRINGE FOR IV PUSH (FOR BLOOD PRESSURE SUPPORT)
PREFILLED_SYRINGE | INTRAVENOUS | Status: DC | PRN
  Administered 2021-01-26: 120 ug via INTRAVENOUS
  Administered 2021-01-26: 80 ug via INTRAVENOUS
  Administered 2021-01-26: 120 ug via INTRAVENOUS

## 2021-01-26 MED ORDER — CHLORHEXIDINE GLUCONATE 0.12 % MT SOLN
15.0000 mL | Freq: Once | OROMUCOSAL | Status: AC
  Administered 2021-01-26: 15 mL via OROMUCOSAL

## 2021-01-26 MED ORDER — HYDRALAZINE HCL 20 MG/ML IJ SOLN
10.0000 mg | Freq: Once | INTRAMUSCULAR | Status: AC
  Administered 2021-01-26: 10 mg via INTRAVENOUS

## 2021-01-26 MED ORDER — MIDAZOLAM HCL 2 MG/2ML IJ SOLN
INTRAMUSCULAR | Status: AC
  Filled 2021-01-26: qty 2

## 2021-01-26 MED ORDER — ACETAMINOPHEN 500 MG PO TABS
1000.0000 mg | ORAL_TABLET | ORAL | Status: AC
  Administered 2021-01-26: 1000 mg via ORAL
  Filled 2021-01-26: qty 2

## 2021-01-26 MED ORDER — SUGAMMADEX SODIUM 200 MG/2ML IV SOLN
INTRAVENOUS | Status: DC | PRN
  Administered 2021-01-26: 200 mg via INTRAVENOUS

## 2021-01-26 MED ORDER — FAMOTIDINE IN NACL 20-0.9 MG/50ML-% IV SOLN
20.0000 mg | Freq: Two times a day (BID) | INTRAVENOUS | Status: DC
  Administered 2021-01-26 – 2021-01-28 (×4): 20 mg via INTRAVENOUS
  Filled 2021-01-26 (×4): qty 50

## 2021-01-26 MED ORDER — DEXTROSE-NACL 5-0.45 % IV SOLN: INTRAVENOUS | Status: DC

## 2021-01-26 MED ORDER — DEXAMETHASONE SODIUM PHOSPHATE 4 MG/ML IJ SOLN: 4.0000 mg | INTRAMUSCULAR | Status: DC

## 2021-01-26 MED ORDER — LEVOTHYROXINE SODIUM 125 MCG PO TABS
125.0000 ug | ORAL_TABLET | Freq: Every day | ORAL | Status: DC
  Administered 2021-01-27 – 2021-01-28 (×2): 125 ug via ORAL
  Filled 2021-01-26 (×2): qty 1

## 2021-01-26 MED ORDER — HYDRALAZINE HCL 25 MG PO TABS: 25.0000 mg | ORAL_TABLET | Freq: Three times a day (TID) | ORAL | Status: DC | PRN

## 2021-01-26 MED ORDER — FENTANYL CITRATE (PF) 100 MCG/2ML IJ SOLN
25.0000 ug | INTRAMUSCULAR | Status: DC | PRN
  Administered 2021-01-26 (×2): 50 ug via INTRAVENOUS

## 2021-01-26 MED ORDER — LACTATED RINGERS IR SOLN
Status: DC | PRN
  Administered 2021-01-26: 1000 mL

## 2021-01-26 MED ORDER — ONDANSETRON HCL 4 MG/2ML IJ SOLN
4.0000 mg | Freq: Once | INTRAMUSCULAR | Status: AC | PRN
  Administered 2021-01-26: 4 mg via INTRAVENOUS

## 2021-01-26 MED ORDER — HYDROMORPHONE HCL 1 MG/ML IJ SOLN
0.2500 mg | INTRAMUSCULAR | Status: DC | PRN
  Administered 2021-01-26 (×4): 0.5 mg via INTRAVENOUS

## 2021-01-26 MED ORDER — OXYCODONE HCL 5 MG/5ML PO SOLN: 5.0000 mg | Freq: Once | ORAL | Status: AC | PRN

## 2021-01-26 MED ORDER — SODIUM CHLORIDE 0.9 % IV SOLN
INTRAVENOUS | Status: AC
  Filled 2021-01-26: qty 2

## 2021-01-26 MED ORDER — ACETAMINOPHEN 160 MG/5ML PO SOLN: 1000.0000 mg | Freq: Three times a day (TID) | ORAL | Status: DC

## 2021-01-26 MED ORDER — LIDOCAINE HCL 2 % IJ SOLN
INTRAMUSCULAR | Status: AC
  Filled 2021-01-26: qty 20

## 2021-01-26 MED ORDER — SODIUM CHLORIDE 0.9 % IV SOLN
2.0000 g | INTRAVENOUS | Status: AC
  Administered 2021-01-26: 2 g via INTRAVENOUS

## 2021-01-26 MED ORDER — OXYCODONE HCL 5 MG PO TABS
ORAL_TABLET | ORAL | Status: AC
  Filled 2021-01-26: qty 1

## 2021-01-26 MED ORDER — SUCCINYLCHOLINE CHLORIDE 200 MG/10ML IV SOSY
PREFILLED_SYRINGE | INTRAVENOUS | Status: AC
  Filled 2021-01-26: qty 10

## 2021-01-26 MED ORDER — SIMETHICONE 80 MG PO CHEW
80.0000 mg | CHEWABLE_TABLET | Freq: Four times a day (QID) | ORAL | Status: DC | PRN
  Administered 2021-01-26 – 2021-01-27 (×2): 80 mg via ORAL
  Filled 2021-01-26 (×2): qty 1

## 2021-01-26 MED ORDER — ROCURONIUM BROMIDE 10 MG/ML (PF) SYRINGE
PREFILLED_SYRINGE | INTRAVENOUS | Status: AC
  Filled 2021-01-26: qty 10

## 2021-01-26 SURGICAL SUPPLY — 65 items
APPLIER CLIP 5 13 M/L LIGAMAX5 (MISCELLANEOUS)
APPLIER CLIP ROT 10 11.4 M/L (STAPLE)
APPLIER CLIP ROT 13.4 12 LRG (CLIP)
BENZOIN TINCTURE PRP APPL 2/3 (GAUZE/BANDAGES/DRESSINGS) IMPLANT
BLADE SURG SZ11 CARB STEEL (BLADE) ×3 IMPLANT
BNDG ADH 1X3 SHEER STRL LF (GAUZE/BANDAGES/DRESSINGS) IMPLANT
CABLE HIGH FREQUENCY MONO STRZ (ELECTRODE) ×3 IMPLANT
CHLORAPREP W/TINT 26 (MISCELLANEOUS) ×3 IMPLANT
CLIP APPLIE 5 13 M/L LIGAMAX5 (MISCELLANEOUS) IMPLANT
CLIP APPLIE ROT 10 11.4 M/L (STAPLE) IMPLANT
CLIP APPLIE ROT 13.4 12 LRG (CLIP) IMPLANT
COVER SURGICAL LIGHT HANDLE (MISCELLANEOUS) ×3 IMPLANT
COVER WAND RF STERILE (DRAPES) ×3 IMPLANT
DEVICE SUTURE ENDOST 10MM (ENDOMECHANICALS) ×3 IMPLANT
DRAIN CHANNEL 19F RND (DRAIN) IMPLANT
DRAIN PENROSE 0.25X18 (DRAIN) IMPLANT
DRAIN PENROSE LF 8X20.3CM SIL (WOUND CARE) ×3 IMPLANT
ELECT L-HOOK LAP 45CM DISP (ELECTROSURGICAL) ×3
ELECTRODE L-HOOK LAP 45CM DISP (ELECTROSURGICAL) ×2 IMPLANT
EVACUATOR SILICONE 100CC (DRAIN) IMPLANT
GAUZE 4X4 16PLY RFD (DISPOSABLE) ×3 IMPLANT
GAUZE SPONGE 4X4 12PLY STRL (GAUZE/BANDAGES/DRESSINGS) IMPLANT
GLOVE SURG POLYISO LF SZ7 (GLOVE) ×3 IMPLANT
GLOVE SURG UNDER POLY LF SZ7 (GLOVE) ×3 IMPLANT
GOWN STRL REUS W/TWL LRG LVL3 (GOWN DISPOSABLE) ×3 IMPLANT
GOWN STRL REUS W/TWL XL LVL3 (GOWN DISPOSABLE) ×9 IMPLANT
GRASPER SUT TROCAR 14GX15 (MISCELLANEOUS) ×3 IMPLANT
HANDLE STAPLE EGIA 4 XL (STAPLE) ×3 IMPLANT
KIT BASIN OR (CUSTOM PROCEDURE TRAY) ×3 IMPLANT
KIT GASTRIC LAVAGE 34FR ADT (SET/KITS/TRAYS/PACK) IMPLANT
KIT TURNOVER KIT A (KITS) ×3 IMPLANT
MARKER SKIN DUAL TIP RULER LAB (MISCELLANEOUS) ×3 IMPLANT
MAT PREVALON FULL STRYKER (MISCELLANEOUS) ×3 IMPLANT
NEEDLE SPNL 22GX3.5 QUINCKE BK (NEEDLE) ×3 IMPLANT
PACK CARDIOVASCULAR III (CUSTOM PROCEDURE TRAY) ×3 IMPLANT
PENCIL SMOKE EVACUATOR (MISCELLANEOUS) IMPLANT
RELOAD EGIA 45 MED/THCK PURPLE (STAPLE) ×3 IMPLANT
RELOAD EGIA 45 TAN VASC (STAPLE) IMPLANT
RELOAD EGIA 60 MED/THCK PURPLE (STAPLE) ×9 IMPLANT
RELOAD EGIA 60 TAN VASC (STAPLE) ×9 IMPLANT
RELOAD ENDO STITCH 2.0 (ENDOMECHANICALS) ×12
SCISSORS LAP 5X45 EPIX DISP (ENDOMECHANICALS) ×3 IMPLANT
SET IRRIG TUBING LAPAROSCOPIC (IRRIGATION / IRRIGATOR) ×3 IMPLANT
SET TUBE SMOKE EVAC HIGH FLOW (TUBING) ×3 IMPLANT
SHEARS HARMONIC ACE PLUS 45CM (MISCELLANEOUS) ×3 IMPLANT
SLEEVE XCEL OPT CAN 5 100 (ENDOMECHANICALS) ×9 IMPLANT
SOL ANTI FOG 6CC (MISCELLANEOUS) ×2 IMPLANT
SOLUTION ANTI FOG 6CC (MISCELLANEOUS) ×1
STAPLER VISISTAT 35W (STAPLE) IMPLANT
STRIP CLOSURE SKIN 1/2X4 (GAUZE/BANDAGES/DRESSINGS) IMPLANT
SUT ETHILON 2 0 PS N (SUTURE) IMPLANT
SUT MNCRL AB 4-0 PS2 18 (SUTURE) ×3 IMPLANT
SUT RELOAD ENDO STITCH 2 48X1 (ENDOMECHANICALS) ×12
SUT RELOAD ENDO STITCH 2.0 (ENDOMECHANICALS) ×12
SUT SILK 0 SH 30 (SUTURE) IMPLANT
SUT VICRYL 0 TIES 12 18 (SUTURE) IMPLANT
SUTURE RELOAD END STTCH 2 48X1 (ENDOMECHANICALS) ×12 IMPLANT
SUTURE RELOAD ENDO STITCH 2.0 (ENDOMECHANICALS) ×12 IMPLANT
SYR 20ML LL LF (SYRINGE) ×3 IMPLANT
SYR 50ML LL SCALE MARK (SYRINGE) ×3 IMPLANT
TOWEL OR 17X26 10 PK STRL BLUE (TOWEL DISPOSABLE) ×3 IMPLANT
TOWEL OR NON WOVEN STRL DISP B (DISPOSABLE) ×3 IMPLANT
TROCAR BLADELESS OPT 5 100 (ENDOMECHANICALS) ×3 IMPLANT
TROCAR XCEL 12X100 BLDLESS (ENDOMECHANICALS) ×3 IMPLANT
TUBING CONNECTING 10 (TUBING) ×3 IMPLANT

## 2021-01-26 NOTE — Anesthesia Procedure Notes (Signed)
Procedure Name: Intubation Date/Time: 01/26/2021 1:00 PM Performed by: Claudia Desanctis, CRNA Pre-anesthesia Checklist: Patient identified, Emergency Drugs available, Suction available and Patient being monitored Patient Re-evaluated:Patient Re-evaluated prior to induction Oxygen Delivery Method: Circle system utilized Preoxygenation: Pre-oxygenation with 100% oxygen Induction Type: IV induction Ventilation: Mask ventilation without difficulty Laryngoscope Size: 2 and Miller Grade View: Grade I Tube type: Oral Tube size: 7.0 mm Number of attempts: 1 Airway Equipment and Method: Stylet Placement Confirmation: ETT inserted through vocal cords under direct vision,  positive ETCO2 and breath sounds checked- equal and bilateral Secured at: 21 cm Tube secured with: Tape Dental Injury: Teeth and Oropharynx as per pre-operative assessment

## 2021-01-26 NOTE — Anesthesia Postprocedure Evaluation (Signed)
Anesthesia Post Note  Patient: Erika Lambert  Procedure(s) Performed: LAPAROSCOPIC ROUX-EN-Y GASTRIC BYPASS WITH UPPER ENDOSCOPY (N/A Abdomen) UPPER GI ENDOSCOPY (N/A Esophagus)     Patient location during evaluation: PACU Anesthesia Type: General Level of consciousness: awake and alert Pain management: pain level controlled Vital Signs Assessment: post-procedure vital signs reviewed and stable Respiratory status: spontaneous breathing, nonlabored ventilation, respiratory function stable and patient connected to nasal cannula oxygen Cardiovascular status: blood pressure returned to baseline and stable Postop Assessment: no apparent nausea or vomiting Anesthetic complications: no   No complications documented.  Last Vitals:  Vitals:   01/26/21 1515 01/26/21 1530  BP: (!) 144/106 (!) 151/107  Pulse: (!) 52 (!) 51  Resp: 12 12  Temp:    SpO2: 100% 100%    Last Pain:  Vitals:   01/26/21 1530  PainSc: 9                  [name] S

## 2021-01-26 NOTE — H&P (Signed)
Erika Lambert is an 55 y.o. female.   Chief Complaint: obesity HPI: 55 yo female with long history of obesity. She has completed all requirements and is ready to proceed with bariatric surgery.  Past Medical History:  Diagnosis Date  . Anxiety   . Arthritis   . Asthma 2016   environmentla asthma   . Cancer [facility]) 2009   thyroid  . GERD (gastroesophageal reflux disease)   . Headache   . Hypothyroidism   . Pneumonia    hx of   . Pre-diabetes   . Sleep apnea    no longer uses cpap     Past Surgical History:  Procedure Laterality Date  . BARIATRIC SURGERY    . BREAST SURGERY Bilateral 2005   lift and skin removed  . BUNIONECTOMY Left   . CESAREAN SECTION  1990  . COSMETIC SURGERY     for skin iremoval due to weight loss   . TOTAL HIP ARTHROPLASTY Right 04/22/2020   Procedure: TOTAL HIP ARTHROPLASTY ANTERIOR APPROACH [place];  Surgeon: Gaynelle Arabian, MD;  Location: [facility] ORS;  Service: Orthopedics;  Laterality: Right;  177min  . TOTAL THYROIDECTOMY  2009    No family history on file. Social History:  reports that she has never smoked. She has never used smokeless tobacco. She reports that she does not drink alcohol and does not use drugs.  Allergies:  Allergies  Allergen Reactions  . Albuterol Hives  . Latex Other (See Comments)    welts  . Penicillins Hives and Other (See Comments)    Dizziness Tolerated Cephalosporin Date: 04/23/20.     Medications Prior to Admission  Medication Sig Dispense Refill  . acetaminophen (TYLENOL) 500 MG tablet Take 500 mg by mouth every 6 (six) hours as needed (pain).    Marland Kitchen diphenhydrAMINE (BENADRYL) 25 mg capsule Take 25 mg by mouth at bedtime as needed for allergies.    Marland Kitchen levocetirizine (XYZAL) 5 MG tablet Take 5 mg by mouth at bedtime as needed for allergies.    Marland Kitchen levothyroxine (SYNTHROID) 125 MCG tablet Take 125 mcg by mouth daily before breakfast.    . Multiple Vitamin (MULTIVITAMIN WITH MINERALS) TABS tablet Take 1 tablet by mouth  daily at 6 PM. Centrum Silver for Women    . HYDROcodone-acetaminophen (NORCO/VICODIN) 5-325 MG tablet Take 1-2 tablets by mouth every 6 (six) hours as needed for severe pain. (Patient not taking: Reported on 01/05/2021) 56 tablet 0    Results for orders placed or performed during the hospital encounter of 01/26/21 (from the past 48 hour(s))  Glucose, capillary     Status: Abnormal   Collection Time: 01/26/21 11:40 AM  Result Value Ref Range   Glucose-Capillary 105 (H) 70 - 99 mg/dL    Comment: Glucose reference range applies only to samples taken after fasting for at least 8 hours.   No results found.  Review of Systems  Constitutional: Negative for chills and fever.  HENT: Negative for hearing loss.   Respiratory: Negative for cough.   Cardiovascular: Negative for chest pain and palpitations.  Gastrointestinal: Negative for abdominal pain, nausea and vomiting.  Genitourinary: Negative for dysuria and urgency.  Musculoskeletal: Negative for myalgias and neck pain.  Skin: Negative for rash.  Neurological: Negative for dizziness and headaches.  Hematological: Does not bruise/bleed easily.  Psychiatric/Behavioral: Negative for suicidal ideas.    Blood pressure (!) 150/100, pulse 78, temperature 98.9 F (37.2 C), resp. rate 16, height 5\' 5"  (1.651 m), weight 121.7 kg, SpO2  96 %. Physical Exam Vitals reviewed.  Constitutional:      Appearance: She is well-developed and well-nourished.  HENT:     Head: Normocephalic and atraumatic.  Eyes:     Extraocular Movements: EOM normal.     Conjunctiva/sclera: Conjunctivae normal.     Pupils: Pupils are equal, round, and reactive to light.  Cardiovascular:     Rate and Rhythm: Normal rate and regular rhythm.  Pulmonary:     Effort: Pulmonary effort is normal.     Breath sounds: Normal breath sounds.  Abdominal:     General: Bowel sounds are normal. There is no distension.     Palpations: Abdomen is soft.     Tenderness: There is no  abdominal tenderness.  Musculoskeletal:        General: Normal range of motion.     Cervical back: Normal range of motion and neck supple.  Skin:    General: Skin is warm and dry.  Neurological:     Mental Status: She is alert and oriented to person, place, and time.  Psychiatric:        Mood and Affect: Mood and affect normal.        Behavior: Behavior normal.    Assessment/Plan 55 yo female with class III obesity -lap RNY gastric bypass -ERAS and bariatric protocol -inpatient admission  Mickeal Skinner, MD 01/26/2021, 11:55 AM

## 2021-01-26 NOTE — Transfer of Care (Signed)
Immediate Anesthesia Transfer of Care Note  Patient: Erika Lambert  Procedure(s) Performed: Procedure(s): LAPAROSCOPIC ROUX-EN-Y GASTRIC BYPASS WITH UPPER ENDOSCOPY (N/A) UPPER GI ENDOSCOPY (N/A)  Patient Location: PACU  Anesthesia Type:General  Level of Consciousness: Alert, Awake, Oriented  Airway & Oxygen Therapy: Patient Spontanous Breathing  Post-op Assessment: Report given to RN  Post vital signs: Reviewed and stable  Last Vitals:  Vitals:   01/26/21 1142  BP: (!) 150/100  Pulse: 78  Resp: 16  Temp: 37.2 C  SpO2: 38%    Complications: No apparent anesthesia complications

## 2021-01-26 NOTE — Progress Notes (Signed)
PHARMACY CONSULT FOR:  Risk Assessment for Post-Discharge VTE Following Bariatric Surgery  Post-Discharge VTE Risk Assessment: This patient's probability of 30-day post-discharge VTE is increased due to the factors marked:   Female    Age >/=60 years    BMI >/=50 kg/m2    CHF    Dyspnea at Rest    Paraplegia  X  Non-gastric-band surgery    Operation Time >/=3 hr    Return to OR     Length of Stay >/= 3 d      Hx of VTE   Hypercoagulable condition   Significant venous stasis   Predicted probability of 30-day post-discharge VTE: 0.16%  Other patient-specific factors to consider: none   Recommendation for Discharge: . No pharmacologic prophylaxis post-discharge . Follow daily and recalculate estimated 30d VTE risk if returns to OR or LOS reaches 3 days.   Erika Lambert is a 55 y.o. female who underwent laparoscopic Roux-en-Y gastric bypass on 3/1   Case start: 1323 Case end: 1444   Allergies  Allergen Reactions  . Albuterol Hives  . Latex Other (See Comments)    welts  . Penicillins Hives and Other (See Comments)    Dizziness Tolerated Cephalosporin Date: 04/23/20.     Patient Measurements: Height: 5\' 5"  (165.1 cm) Weight: 121.7 kg (268 lb 3.2 oz) IBW/kg (Calculated) : 57 Body mass index is 44.63 kg/m.  Recent Labs    01/26/21 1813  HGB 13.8  HCT 42.8   Estimated Creatinine Clearance: 107.7 mL/min (by C-G formula based on SCr of 0.77 mg/dL).    Past Medical History:  Diagnosis Date  . Anxiety   . Arthritis   . Asthma 2016   environmentla asthma   . Cancer [facility]) 2009   thyroid  . GERD (gastroesophageal reflux disease)   . Headache   . Hypothyroidism   . Pneumonia    hx of   . Pre-diabetes   . Sleep apnea    no longer uses cpap      Medications Prior to Admission  Medication Sig Dispense Refill Last Dose  . acetaminophen (TYLENOL) 500 MG tablet Take 500 mg by mouth every 6 (six) hours as needed (pain).   01/25/2021 at Unknown time  .  diphenhydrAMINE (BENADRYL) 25 mg capsule Take 25 mg by mouth at bedtime as needed for allergies.   01/24/2021  . levocetirizine (XYZAL) 5 MG tablet Take 5 mg by mouth at bedtime as needed for allergies.   01/25/2021 at Unknown time  . levothyroxine (SYNTHROID) 125 MCG tablet Take 125 mcg by mouth daily before breakfast.   01/26/2021 at 0900  . Multiple Vitamin (MULTIVITAMIN WITH MINERALS) TABS tablet Take 1 tablet by mouth daily at 6 PM. Centrum Silver for Women   01/24/2021  . HYDROcodone-acetaminophen (NORCO/VICODIN) 5-325 MG tablet Take 1-2 tablets by mouth every 6 (six) hours as needed for severe pain. (Patient not taking: Reported on 01/05/2021) 56 tablet 0 Not Taking at Unknown time      Reuel Boom, PharmD, BCPS [phone] 01/26/2021, 6:36 PM

## 2021-01-26 NOTE — Progress Notes (Signed)
Seen pt in PACU. Discussed post op day goals with patient including ambulation, IS, diet progression, pain, and nausea control.  BSTOP education provided including BSTOP information guide, "Guide for Pain Management after your Bariatric Procedure".  Questions answered.

## 2021-01-26 NOTE — Op Note (Signed)
   Patient: Erika Lambert (08-26-1966, [id number])  Date of Surgery: 01/26/2021   Preoperative Diagnosis: MORBID OBESITY   Postoperative Diagnosis: morbid obesity   Surgical Procedure: Upper Endoscopy   Surgeon: Louanna Raw, MD  Anesthesiologist: Myrtie Soman, MD CRNA: Claudia Desanctis, CRNA; Gerald Leitz, CRNA   Anesthesia: General   Fluids:  Total I/O In: 1100 [I.V.:1000; IV [id number]:016] Out: -   Complications: None  Drains:  None  Specimen: None   Indications for Procedure: Erika Lambert is a 55 y.o. female undergoing gastric bypass and an EGD was requested to evaluate foregut anatomy intraoperatively.  Description of Procedure: During the procedure, I scrubbed out and obtained the Olympus endoscope. I gently placed endoscope in the patient's oropharynx and gently glided it down the esophagus without any difficulty under direct visualization.  The scope was advanced as far as the [place] anastamosis and then slowly withdrawn to inspect the foregut anatomy.  Dr. Kieth Brightly had placed saline in the upper abdomen and all staple lines were submerged to ensure no air leak. There was no evidence of bubbles. There was no evidence of intraluminal bleeding and the mucosa appeared healthy.  The lumen was widely patent without evidence of stricture.  The intraluminal insufflation was decompressed. The scope was withdrawn. The patient tolerated this portion of the procedure well. Please see Dr Amie Portland operative note for details regarding the remainder of the procedure.    Louanna Raw, MD General, Bariatric, & Minimally Invasive Surgery [facility], [place]

## 2021-01-26 NOTE — Anesthesia Preprocedure Evaluation (Signed)
Anesthesia Evaluation  Patient identified by MRN, date of birth, ID band Patient awake    Reviewed: Allergy & Precautions, H&P , NPO status , Patient's Chart, lab work & pertinent test results  Airway Mallampati: II  TM Distance: >3 FB Neck ROM: Full    Dental no notable dental hx.    Pulmonary sleep apnea and Continuous Positive Airway Pressure Ventilation ,    Pulmonary exam normal breath sounds clear to auscultation       Cardiovascular negative cardio ROS Normal cardiovascular exam Rhythm:Regular Rate:Normal     Neuro/Psych negative neurological ROS  negative psych ROS   GI/Hepatic Neg liver ROS, GERD  Medicated,  Endo/Other  Hypothyroidism Morbid obesity  Renal/GU negative Renal ROS  negative genitourinary   Musculoskeletal negative musculoskeletal ROS (+)   Abdominal   Peds negative pediatric ROS (+)  Hematology negative hematology ROS (+)   Anesthesia Other Findings   Reproductive/Obstetrics negative OB ROS                             Anesthesia Physical Anesthesia Plan  ASA: III  Anesthesia Plan: General   Post-op Pain Management:    Induction: Intravenous  PONV Risk Score and Plan: 3 and Ondansetron, Dexamethasone, Scopolamine patch - Pre-op, Midazolam and Treatment may vary due to age or medical condition  Airway Management Planned: Oral ETT  Additional Equipment:   Intra-op Plan:   Post-operative Plan: Extubation in OR  Informed Consent: I have reviewed the patients History and Physical, chart, labs and discussed the procedure including the risks, benefits and alternatives for the proposed anesthesia with the patient or authorized representative who has indicated his/her understanding and acceptance.     Dental advisory given  Plan Discussed with: CRNA and Surgeon  Anesthesia Plan Comments:         Anesthesia Quick Evaluation

## 2021-01-26 NOTE — Op Note (Signed)
Preop Diagnosis: Obesity Class III  Postop Diagnosis: same  Procedure performed: laparoscopic Roux en Y gastric bypass  Assitant: Louanna Raw  Indications:  The patient is a 55 y.o. year-old morbidly obese female who has been followed in the Bariatric Clinic as an outpatient. This patient was diagnosed with morbid obesity with a BMI of Body mass index is 44.63 kg/m. and significant co-morbidities including GERD.  The patient was counseled extensively in the Bariatric Outpatient Clinic and after a thorough explanation of the risks and benefits of surgery (including death from complications, bowel leak, infection such as peritonitis and/or sepsis, internal hernia, bleeding, need for blood transfusion, bowel obstruction, organ failure, pulmonary embolus, deep venous thrombosis, wound infection, incisional hernia, skin breakdown, and others entailed on the consent form) and after a compliant diet and exercise program, the patient was scheduled for an elective laparoscopic gastric bypass.  Description of Operation:  Following informed consent, the patient was taken to the operating room and placed on the operating table in the supine position.  She had previously received prophylactic antibiotics and subcutaneous heparin for DVT prophylaxis in the pre-op holding area.  After induction of general endotracheal anesthesia by the anesthesiologist, the patient underwent placement of sequential compression devices, Foley catheter and an oro-gastric tube.  A timeout was confirmed by the surgery and anesthesia teams.  The patient was adequately padded at all pressure points and placed on a footboard to prevent slippage from the OR table during extremes of position during surgery.  She underwent a routine sterile prep and drape of her entire abdomen.    Next, A transverse incision was made under the left subcostal area and a 32mm optical viewing trocar was introduced into the peritoneal cavity.  Pneumoperitoneum was applied with a high flow and low pressure. A laparoscope was inserted to confirm placement. A extraperitoneal block was then placed at the lateral abdominal wall using exparel diluted with marcaine . 5 additional trocars were placed: 1 3mm trocar to the left of the midline. 1 additional 32mm trocar in the left lateral area, 1 68mm trocar in the right mid abdomen, and 1 51mm trocar in the right subcostal area.  The greater omentum was flipped over the transverse colon and under the left lobe of the liver. The ligament of trietz was identified. 40cm of jejunum was measured starting from the ligament of Trietz. The mesentery was checked to ensure mobility. Next, a 43mm 2-45mm tristapler was used to divide the jejunum at this location. The harmonic scalpel was used to divide the mesentery down to the origin. A 1/2" penrose was sutured to the distal side. 100cm of jejunum was measured starting at the division. 2-0 silk was used to appose the biliary limb to the 100cm mark of jejunum in 2 places. Enterotomies were made in the biliary and common channels and a 43mm 2-3 tristapler was used to create the J-J anastomosis. A 2-0 silk was used to appose the enterotomy edges and a 52mm 2-3 tristapler was used to close the enterotomy. An anti-obstruction 2-0 silk suture was placed. Next, the mesenteric defect was closed with a 2-0 silk in running fashion.The J-J appeared patent and in neutral position.  Next, the omentum was divided using the Harmonic scalpel. The patient was placed in steep Reverse Trendelenberg position. 1 5 mm trocar was placed in the right lateral space and diamond liver retractor placed. The fat pad over the fundus was incised to free the fundus. Next, a position along the lesser curve 6cm  from GE junction was identified. The pars flaccida was entered and the fat over the lesser curve divided to enter the lesser sac. Multiple 29mm 3-30mm tristaple firings were peformed to create a 6cm  pouch. The Roux limb was identified using the placed penrose and brought up to the stomach in antecolic fashion. The limb was inspected to ensure a neutral position. A 2-0 vicryl suture was then used to create a posterior layer connecting the stomach to the Roux limb jejunum in running fashion. Next cautery was used to create an enterotomy along the medial aspect of this suture line and Harmonic scalpel used to create gastotomy. A 44mm 3-53mm tristapler was then used to create a 25-80mm anastomosis. 2 2-0 vicryl sutures were used in running fashion to close the gastrotomy. Finally, a 2-0 vicryl suture was used to close an anterior layer of stomach and jejunum over the anastomosis in running fashion. The penrose was removed from the Roux limb. A 2-0 silk was used to appose the transverse mesocolon to the mesentery of the Roux limb.   The assistant then went and performed an upper endoscopy and leak test. No bubbles were seen and the pouch and limb distended appropriately. The limb and pouch were deflated, the endoscope was removed. Hemostasis was ensured. Pneumoperitoneum was evacuated, all ports were removed and all incisions closed with 4-0 monocryl suture in subcuticular fashion. Steristrips and bandaids were put in place for dressing. The patient awoke from anesthesia and was brought to pacu in stable condition. All counts were correct.  Specimens:  None  Estimated Blood Loss: 10 ml  Local Anesthesia: 50 ml Exparel: 0.5% Marcaine Mix  Post-Op Plan:       Pain Management: PO, prn      Antibiotics: Prophylactic      Anticoagulation: Prophylactic, Starting now      Post Op Studies/Consults: Not applicable      Intended Discharge: within 48h      Intended Outpatient Follow-Up: Two Week      Intended Outpatient Studies: Not Applicable      Other: Not Applicable   [name] Derek Laughter

## 2021-01-27 ENCOUNTER — Encounter (HOSPITAL_COMMUNITY): Payer: Self-pay | Admitting: General Surgery

## 2021-01-27 LAB — COMPREHENSIVE METABOLIC PANEL
ALT: 14 U/L (ref 0–44)
AST: 35 U/L (ref 15–41)
Albumin: 3.8 g/dL (ref 3.5–5.0)
Alkaline Phosphatase: 61 U/L (ref 38–126)
Anion gap: 12 (ref 5–15)
BUN: 9 mg/dL (ref 6–20)
CO2: 20 mmol/L — ABNORMAL LOW (ref 22–32)
Calcium: 8.8 mg/dL — ABNORMAL LOW (ref 8.9–10.3)
Chloride: 98 mmol/L (ref 98–111)
Creatinine, Ser: 0.72 mg/dL (ref 0.44–1.00)
GFR, Estimated: >60 mL/min (ref >60)
Glucose, Bld: 159 mg/dL — ABNORMAL HIGH (ref 70–99)
Potassium: 4.1 mmol/L (ref 3.5–5.1)
Sodium: 130 mmol/L — ABNORMAL LOW (ref 135–145)
Total Bilirubin: 0.7 mg/dL (ref 0.3–1.2)
Total Protein: 7.2 g/dL (ref 6.5–8.1)

## 2021-01-27 LAB — CBC WITH DIFFERENTIAL/PLATELET
Abs Immature Granulocytes: 0.06 10*3/uL (ref 0.00–0.07)
Abs Immature Granulocytes: 0.1 10*3/uL — ABNORMAL HIGH (ref 0.00–0.07)
Basophils Absolute: 0 10*3/uL (ref 0.0–0.1)
Basophils Absolute: 0 10*3/uL (ref 0.0–0.1)
Basophils Relative: 0 %
Basophils Relative: 0 %
Eosinophils Absolute: 1.3 10*3/uL — ABNORMAL HIGH (ref 0.0–0.5)
Eosinophils Absolute: 0 10*3/uL (ref 0.0–0.5)
Eosinophils Relative: 7 %
Eosinophils Relative: 0 %
HCT: 42.2 % (ref 36.0–46.0)
HCT: 38.9 % (ref 36.0–46.0)
Hemoglobin: 13.5 g/dL (ref 12.0–15.0)
Hemoglobin: 12.8 g/dL (ref 12.0–15.0)
Immature Granulocytes: 0 %
Immature Granulocytes: 1 %
Lymphocytes Relative: 4 %
Lymphocytes Relative: 8 %
Lymphs Abs: 0.8 10*3/uL (ref 0.7–4.0)
Lymphs Abs: 1.5 10*3/uL (ref 0.7–4.0)
MCH: 29.5 pg (ref 26.0–34.0)
MCH: 30.3 pg (ref 26.0–34.0)
MCHC: 32 g/dL (ref 30.0–36.0)
MCHC: 32.9 g/dL (ref 30.0–36.0)
MCV: 92.3 fL (ref 80.0–100.0)
MCV: 92.2 fL (ref 80.0–100.0)
Monocytes Absolute: 1 10*3/uL (ref 0.1–1.0)
Monocytes Absolute: 1.3 10*3/uL — ABNORMAL HIGH (ref 0.1–1.0)
Monocytes Relative: 6 %
Monocytes Relative: 7 %
Neutro Abs: 15.3 10*3/uL — ABNORMAL HIGH (ref 1.7–7.7)
Neutro Abs: 15.8 10*3/uL — ABNORMAL HIGH (ref 1.7–7.7)
Neutrophils Relative %: 83 %
Neutrophils Relative %: 84 %
Platelets: 203 10*3/uL (ref 150–400)
Platelets: 218 10*3/uL (ref 150–400)
RBC: 4.57 MIL/uL (ref 3.87–5.11)
RBC: 4.22 MIL/uL (ref 3.87–5.11)
RDW: 13.6 % (ref 11.5–15.5)
RDW: 14 % (ref 11.5–15.5)
WBC: 18.5 10*3/uL — ABNORMAL HIGH (ref 4.0–10.5)
WBC: 18.8 10*3/uL — ABNORMAL HIGH (ref 4.0–10.5)
nRBC: 0 % (ref 0.0–0.2)
nRBC: 0 % (ref 0.0–0.2)

## 2021-01-27 NOTE — Progress Notes (Signed)
Nutrition Education Note ° °Received consult for diet education for patient s/p bariatric surgery. ° °Discussed 2 week post op diet with pt. Emphasized that liquids must be non carbonated, non caffeinated, and sugar free. Fluid goals discussed. Pt to follow up with outpatient bariatric RD for further diet progression after 2 weeks. Multivitamins and minerals also reviewed. Teach back method used, pt expressed understanding, expect good compliance. ° °If nutrition issues arise, please consult RD. ° °Lindsey Baker, MS, RD, LDN °Inpatient Clinical Dietitian °Contact information available via Amion ° ° °

## 2021-01-27 NOTE — Discharge Instructions (Signed)
GASTRIC BYPASS / SLEEVE  Home Care Instructions  These instructions are to help you care for yourself when you go home.  Call: If you have any problems. . Call [phone] and ask for the surgeon on call . If you have an emergency related to your surgery please use the ER at [facility].  . Tell the ER staff that you are a new post-op gastric bypass or gastric sleeve patient   Signs and symptoms to report: . Severe vomiting or nausea o If you cannot handle clear liquids for longer than 1 day, call your surgeon  . Abdominal pain which does not get better after taking your pain medication . Fever greater than 100.4 F and chills . Heart rate over 100 beats a Erika . Trouble breathing . Chest pain .  Redness, swelling, drainage, or foul odor at incision (surgical) sites .  If your incisions open or pull apart . Swelling or pain in calf (lower leg) . Diarrhea (Loose bowel movements that happen often), frequent watery, uncontrolled bowel movements . Constipation, (no bowel movements for 3 days) if this happens:  o Take Milk of Magnesia, 2 tablespoons by mouth, 3 times a day for 2 days if needed o Stop taking Milk of Magnesia once you have had a bowel movement o Call your doctor if constipation continues Or o Take Miralax  (instead of Milk of Magnesia) following the label instructions o Stop taking Miralax once you have had a bowel movement o Call your doctor if constipation continues . Anything you think is "abnormal for you"   Normal side effects after surgery: . Unable to sleep at night or unable to concentrate . Irritability . Being tearful (crying) or depressed These are common complaints, possibly related to your anesthesia, stress of surgery and change in lifestyle, that usually go away a few weeks after surgery.  If these feelings continue, call your medical doctor.  Wound Care: You may have surgical glue, steri-strips, or staples over your incisions after surgery . Surgical  glue:  Looks like a clear film over your incisions and will wear off a little at a time . Steri-strips : Adhesive strips of tape over your incisions. You may notice a yellowish color on the skin under the steri-strips. This is used to make the   steri-strips stick better. Do not pull the steri-strips off - let them fall off . Staples: Staples may be removed before you leave the hospital o If you go home with staples, call [facility] for an appointment with your surgeon's nurse to have staples removed 10 days after surgery, [phone] . Showering: You may shower two (2) days after your surgery unless your surgeon tells you differently o Wash gently around incisions with warm soapy water, rinse well, and gently pat dry  o If you have a drain (tube from your incision), you may need someone to hold this while you shower  o No tub baths until staples are removed and incisions are healed     Medications: . Medications should be liquid or crushed if larger than the size of a dime . Extended release pills (medication that releases a little bit at a time through the day) should not be crushed . Depending on the size and number of medications you take, you may need to space (take a few throughout the day)/change the time you take your medications so that you do not over-fill your pouch (smaller stomach) . Make sure you follow-up with   your primary care physician to make medication changes needed during rapid weight loss and life-style changes . If you have diabetes, follow up with the doctor that orders your diabetes medication(s) within one week after surgery and check your blood sugar regularly. . Do not drive while taking narcotics (pain medications) . DO NOT take NSAID'S (Examples of NSAID's include ibuprofen, naproxen)  Diet:                    First 2 Weeks  You will see the nutritionist about two (2) weeks after your surgery. The nutritionist will increase the types of foods you can  eat if you are handling liquids well: . If you have severe vomiting or nausea and cannot handle clear liquids lasting longer than 1 day, call your surgeon  Protein Shake . Drink at least 2 ounces of shake 5-6 times per day . Each serving of protein shakes (usually 8 - 12 ounces) should have a minimum of:  o 15 grams of protein  o And no more than 5 grams of carbohydrate  . Goal for protein each day: o Men = 80 grams per day o Women = 60 grams per day . Protein powder may be added to fluids such as non-fat milk or Lactaid milk or Soy milk (limit to 35 grams added protein powder per serving)  Hydration . Slowly increase the amount of water and other clear liquids as tolerated (See Acceptable Fluids) . Slowly increase the amount of protein shake as tolerated  .  Sip fluids slowly and throughout the day . May use sugar substitutes in small amounts (no more than 6 - 8 packets per day; i.e. Splenda)  Fluid Goal . The first goal is to drink at least 8 ounces of protein shake/drink per day (or as directed by the nutritionist);  See handout from pre-op Bariatric Education Class for examples of protein shake/drink.   o Slowly increase the amount of protein shake you drink as tolerated o You may find it easier to slowly sip shakes throughout the day o It is important to get your proteins in first . Your fluid goal is to drink 64 - 100 ounces of fluid daily o It may take a few weeks to build up to this . 32 oz (or more) should be clear liquids  And  . 32 oz (or more) should be full liquids (see below for examples) . Liquids should not contain sugar, caffeine, or carbonation  Clear Liquids: . Water or Sugar-free flavored water (i.e. Fruit H2O, Propel) . Decaffeinated coffee or tea (sugar-free) . Erika Lambert, Erika Lambert, Erika Lambert . Sugar-free Jell-O . Bouillon or broth . Sugar-free Popsicle:   *Less than 20 calories each; Limit 1 per day  Full Liquids: Protein Shakes/Drinks + 2  choices per day of other full liquids . Full liquids must be: o No More Than 12 grams of Carbs per serving  o No More Than 3 grams of Fat per serving . Strained low-fat cream soup . Non-Fat milk . Fat-free Lactaid Milk . Sugar-free yogurt (Dannon Lambert & Fit, Greek yogurt)      Vitamins and Minerals . Start 1 day after surgery unless otherwise directed by your surgeon . Bariatric Specific Complete Multivitamins . Chewable Calcium Citrate with Vitamin D-3 (Example: 3 Chewable Calcium Plus 600 with Vitamin D-3) o Take 500 mg three (3) times a day for a total of 1500 mg each day o Do not take all 3 doses   of calcium at one time as it may cause constipation, and you can only absorb 500 mg  at a time  o Do not mix multivitamins containing iron with calcium supplements; take 2 hours apart  . Menstruating women and those at risk for anemia (a blood disease that causes weakness) may need extra iron o Talk with your doctor to see if you need more iron . If you need extra iron: Total daily Iron recommendation (including Vitamins) is 50 to 100 mg Iron/day . Do not stop taking or change any vitamins or minerals until you talk to your nutritionist or surgeon . Your nutritionist and/or surgeon must approve all vitamin and mineral supplements   Activity and Exercise: It is important to continue walking at home.  Limit your physical activity as instructed by your doctor.  During this time, use these guidelines: . Do not lift anything greater than ten (10) pounds for at least two (2) weeks . Do not go back to work or drive until your surgeon says you can . You may have sex when you feel comfortable  o It is VERY important for female patients to use a reliable birth control method; fertility often increases after surgery  o Do not get pregnant for at least 18 months . Start exercising as soon as your doctor tells you that you can o Make sure your doctor approves any physical activity . Start with a  simple walking program . Walk 5-15 minutes each day, 7 days per week.  . Slowly increase until you are walking 30-45 minutes per day Consider joining our BELT program. [phone] or email [email or web address]   Special Instructions Things to remember:  . Use your CPAP when sleeping if this applies to you, do not stop the use of CPAP unless directed by physician after a sleep study . [facility] has a free Bariatric Surgery Support Group that meets monthly, the 3rd Thursday, 6 pm.  Please review discharge information for date and location of this meeting. . It is very important to keep all follow up appointments with your surgeon, nutritionist, primary care physician, and behavioral health practitioner o After the first year, please follow up with your bariatric surgeon and nutritionist at least once a year in order to maintain best weight loss results   [facility]: [phone] [facility]: [phone] Bariatric Nurse Coordinator: [phone]      

## 2021-01-27 NOTE — Progress Notes (Signed)
Patient alert and oriented, Post op day 1.  Provided support and encouragement.  Encouraged pulmonary toilet, ambulation and small sips of liquids.  All questions answered.  Will continue to monitor. 

## 2021-01-27 NOTE — Progress Notes (Signed)
Patient alert and oriented, pain is controlled. Patient is tolerating fluids, advanced to protein shake today, patient is tolerating well. Reviewed Gastric Bypass discharge instructions with patient and patient is able to articulate understanding. Provided information on BELT program, Support Group and [facility] outpatient pharmacy. All questions answered, will continue to monitor.  Pt will stay to monitor overnight.

## 2021-01-28 ENCOUNTER — Other Ambulatory Visit (HOSPITAL_COMMUNITY): Payer: Self-pay | Admitting: General Surgery

## 2021-01-28 LAB — CBC WITH DIFFERENTIAL/PLATELET
Abs Immature Granulocytes: 0.06 10*3/uL (ref 0.00–0.07)
Basophils Absolute: 0 10*3/uL (ref 0.0–0.1)
Basophils Relative: 0 %
Eosinophils Absolute: 0 10*3/uL (ref 0.0–0.5)
Eosinophils Relative: 0 %
HCT: 33.9 % — ABNORMAL LOW (ref 36.0–46.0)
Hemoglobin: 10.9 g/dL — ABNORMAL LOW (ref 12.0–15.0)
Immature Granulocytes: 0 %
Lymphocytes Relative: 12 %
Lymphs Abs: 1.6 10*3/uL (ref 0.7–4.0)
MCH: 30 pg (ref 26.0–34.0)
MCHC: 32.2 g/dL (ref 30.0–36.0)
MCV: 93.4 fL (ref 80.0–100.0)
Monocytes Absolute: 1.1 10*3/uL — ABNORMAL HIGH (ref 0.1–1.0)
Monocytes Relative: 8 %
Neutro Abs: 10.9 10*3/uL — ABNORMAL HIGH (ref 1.7–7.7)
Neutrophils Relative %: 80 %
Platelets: 167 10*3/uL (ref 150–400)
RBC: 3.63 MIL/uL — ABNORMAL LOW (ref 3.87–5.11)
RDW: 14.1 % (ref 11.5–15.5)
WBC: 13.6 10*3/uL — ABNORMAL HIGH (ref 4.0–10.5)
nRBC: 0 % (ref 0.0–0.2)

## 2021-01-28 MED ORDER — OMEPRAZOLE 20 MG PO CPDR: 20.0000 mg | DELAYED_RELEASE_CAPSULE | Freq: Two times a day (BID) | ORAL | 0 refills | Status: AC

## 2021-01-28 MED ORDER — GABAPENTIN 100 MG PO CAPS: 200.0000 mg | ORAL_CAPSULE | Freq: Two times a day (BID) | ORAL | 0 refills | Status: DC

## 2021-01-28 MED ORDER — ONDANSETRON 4 MG PO TBDP: 4.0000 mg | ORAL_TABLET | Freq: Four times a day (QID) | ORAL | 0 refills | Status: DC | PRN

## 2021-01-28 NOTE — Progress Notes (Signed)
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 -recheck labs this afternoon.  Addendum -afternoon labs stable, will watch patient overnight

## 2021-01-28 NOTE — Plan of Care (Signed)
Instructions were reviewed with patient. All questions were answered. Patient was transported to main entrance by wheelchair. ° °

## 2021-02-02 ENCOUNTER — Telehealth (HOSPITAL_COMMUNITY): Payer: Self-pay | Admitting: *Deleted

## 2021-02-02 NOTE — Telephone Encounter (Signed)
1.  Tell me about your pain and pain management? Pt c/o pain and discomfort with bloating after attempting to drink protein shakes.  Pt states that the "gas medicine" helps with the bloating and pain.   2.  Let's talk about fluid intake.  How much total fluid are you taking in? Pt states that she is working to get in at least 64oz of fluid including unjury protein chicken soups and water.  Pt states that anything with lactose or dye in it is making her bloat and have discomfort.  Pt states that she has tried almond milk, soy milk, cream soups, and protein shakes (Premier, [place], etc). Pt states she called [facility] yesterday and was prescribed "gas medicine" that is the same as what's in the [place] that she is taking for the gas and bloating.  Pt states that she is only able to get in about 24 oz with the chicken soup and some water due to the bloating and discomfort.  Pt encouraged to try vegetable broth and chicken broth to supplement fluid.   Pt encouraged to call [facility] if not able to meet goal or worsened symptoms.    3.  How much protein have you taken in the last 2 days? Pt states she is working to meet her goal of 60g of protein with the unjury protein chick soup.    4.  Have you had nausea?  Tell me about when have experienced nausea and what you did to help? Pt states that she has had some nausea with the bloating but it gets better with the "gas medicine"   5.  Has the frequency or color changed with your urine? Pt states that she is urinating "fine" with no changes in frequency or urgency.     6.  Tell me what your incisions look like? "Incisions look good".  Several incisions has "lost their strips".  Pt denies a fever, chills.  Pt states that incisions are not swollen, open, or draining.  Pt encouraged to call [facility] if incisions change.   7.  Have you been passing gas? BM? Pt states that she is having BMs. Last BM 02/02/21.     8.  If a problem or question were to arise who would you  call?  Do you know contact numbers for [place] [facility], [facility], and [facility]? Pt denies dehydration symptoms.  Pt can describe s/sx of dehydration.  Pt knows to call [facility] for surgical, [facility] for nutrition, and [place] [facility] for non-urgent questions or concerns.   9.  How has the walking going? Pt states she is walking around "excessively to get rid of the gas".    10.  How are your vitamins and calcium going?  How are you taking them? Pt states that she is taking her supplements and vitamins without difficulty.

## 2021-02-08 ENCOUNTER — Telehealth (HOSPITAL_COMMUNITY): Payer: Self-pay | Admitting: *Deleted

## 2021-02-09 ENCOUNTER — Other Ambulatory Visit: Payer: Self-pay

## 2021-02-09 ENCOUNTER — Encounter: Payer: Federal, State, Local not specified - PPO | Attending: General Surgery | Admitting: Skilled Nursing Facility1

## 2021-02-09 NOTE — Progress Notes (Signed)
2 Week Post-Operative Nutrition Class   Patient was seen on 01/22/19 for Post-Operative Nutrition education at [facility].    Surgery date: 01/26/2021 Surgery type: RYGB Start weight at [facility]: 260.2 Weight today: 251.1   Body Composition Scale 02/09/2021  Current Body Weight 251.1  Total Body Fat % 45.2  Visceral Fat 16  Fat-Free Mass % 54.7   Total Body Water % 41.8  Muscle-Mass lbs 32.1  BMI 41  Body Fat Displacement          Torso  lbs 70.3         Left Leg  lbs 14         Right Leg  lbs 14         Left Arm  lbs 7         Right Arm   lbs 7      The following the learning objectives were met by the patient during this course:  Identifies Phase 3 (Soft, High Proteins) Dietary Goals and will begin from 2 weeks post-operatively to 2 months post-operatively  Identifies appropriate sources of fluids and proteins   Identifies appropriate fat sources and healthy verses unhealthy fat types    States protein recommendations and appropriate sources post-operatively  Identifies the need for appropriate texture modifications, mastication, and bite sizes when consuming solids  Identifies appropriate multivitamin and calcium sources post-operatively  Describes the need for physical activity post-operatively and will follow MD recommendations  States when to call healthcare provider regarding medication questions or post-operative complications   Handouts given during class include:  Phase 3A: Soft, High Protein Diet Handout  Phase 3 High Protein Meals  Healthy Fats   Follow-Up Plan: Patient will follow-up at [facility] in 6 weeks for 2 month post-op nutrition visit for diet advancement per MD.

## 2021-02-09 NOTE — Discharge Summary (Signed)
Physician Discharge Summary  Erika Lambert OVZ:[id number] DOB: August 26, 1966 DOA: 01/26/2021  PCP: Glenis Smoker, MD  Admit date: 01/26/2021 Discharge date: 02/09/2021  Recommendations for Outpatient Follow-up:  1.  (include homehealth, outpatient follow-up instructions, specific recommendations for PCP to follow-up on, etc.)   Follow-up Information    [name], [name], MD. Go on 02/18/2021.   Specialty: General Surgery Why: at 1:40pm. Please arrive 15 minutes prior to your appointment time.  Thank you. Contact information: [place] [address] [phone]        Surgery, [place]. Go on 03/25/2021.   Specialty: General Surgery Why: at 9:10am for Dr. Kieth Brightly.  Please arrive 15 minutes prior to your appointment time.  Thank you. Contact information: [address] [place] [address] [phone]              Discharge Diagnoses:  Active Problems:   Morbid obesity (Erika Lambert)   Surgical Procedure: Roux-en-Y gastric bypass, upper endoscopy  Discharge Condition: Good Disposition: Home  Diet recommendation: Postoperative sleeve gastrectomy diet (liquids only)  Filed Weights   01/26/21 1142  Weight: 121.7 kg     Hospital Course:  The patient was admitted after undergoing Roux-en-Y gastric bypass. POD 0 she ambulated well. POD 1 she was started on the water diet protocol and tolerated 300 ml in the first shift. She had a persistent high leukocytosis and was watched overnight and it improved. Once meeting the water amount she was advanced to bariatric protein shakes which they tolerated and were discharged home POD 2.  Treatments: surgery: Roux-en-Y gastric bypass  Discharge Instructions  Discharge Instructions    Ambulate hourly while awake   Complete by: As directed    Call MD for:  difficulty breathing, headache or visual disturbances   Complete by: As directed    Call MD for:  persistant dizziness or  light-headedness   Complete by: As directed    Call MD for:  persistant nausea and vomiting   Complete by: As directed    Call MD for:  redness, tenderness, or signs of infection (pain, swelling, redness, odor or green/yellow discharge around incision site)   Complete by: As directed    Call MD for:  severe uncontrolled pain   Complete by: As directed    Call MD for:  temperature >101 F   Complete by: As directed    Diet bariatric full liquid   Complete by: As directed    Discharge wound care:   Complete by: As directed    Remove Bandaids tomorrow, ok to shower tomorrow. Steristrips may fall off in 1-3 weeks.   Incentive spirometry   Complete by: As directed    Perform hourly while awake     Allergies as of 01/28/2021      Reactions   Albuterol Hives   Latex Other (See Comments)   welts   Penicillins Hives, Other (See Comments)   Dizziness Tolerated Cephalosporin Date: 04/23/20.      Medication List    STOP taking these medications   HYDROcodone-acetaminophen 5-325 MG tablet Commonly known as: NORCO/VICODIN     TAKE these medications   acetaminophen 500 MG tablet Commonly known as: TYLENOL Take 500 mg by mouth every 6 (six) hours as needed (pain).   diphenhydrAMINE 25 mg capsule Commonly known as: BENADRYL Take 25 mg by mouth at bedtime as needed for allergies.   gabapentin 100 MG capsule Commonly known as: NEURONTIN Take 2 capsules (200  mg total) by mouth every 12 (twelve) hours.   levocetirizine 5 MG tablet Commonly known as: XYZAL Take 5 mg by mouth at bedtime as needed for allergies.   levothyroxine 125 MCG tablet Commonly known as: SYNTHROID Take 125 mcg by mouth daily before breakfast.   multivitamin with minerals Tabs tablet Take 1 tablet by mouth daily at 6 PM. Centrum Silver for Women   omeprazole 20 MG capsule Commonly known as: PriLOSEC Take 1 capsule (20 mg total) by mouth 2 (two) times daily.   ondansetron 4 MG disintegrating  tablet Commonly known as: ZOFRAN-ODT Take 1 tablet (4 mg total) by mouth every 6 (six) hours as needed for nausea or vomiting.            Discharge Care Instructions  (From admission, onward)         Start     Ordered   01/28/21 0000  Discharge wound care:       Comments: Remove Bandaids tomorrow, ok to shower tomorrow. Steristrips may fall off in 1-3 weeks.   01/28/21 4401          Follow-up Information    [name], [name], MD. Go on 02/18/2021.   Specialty: General Surgery Why: at 1:40pm. Please arrive 15 minutes prior to your appointment time.  Thank you. Contact information: [place] [address] [phone]        Surgery, [place]. Go on 03/25/2021.   Specialty: General Surgery Why: at 9:10am for Dr. Kieth Brightly.  Please arrive 15 minutes prior to your appointment time.  Thank you. Contact information: [address] [place] [address] [phone]                The results of significant diagnostics from this hospitalization (including imaging, microbiology, ancillary and laboratory) are listed below for reference.    Significant Diagnostic Studies: No results found.  Labs: Basic Metabolic Panel: No results for input(s): NA, K, CL, CO2, GLUCOSE, BUN, CREATININE, CALCIUM, MG, PHOS in the last 168 hours. Liver Function Tests: No results for input(s): AST, ALT, ALKPHOS, BILITOT, PROT, ALBUMIN in the last 168 hours.  CBC: No results for input(s): WBC, NEUTROABS, HGB, HCT, MCV, PLT in the last 168 hours.  CBG: No results for input(s): GLUCAP in the last 168 hours.  Active Problems:   Morbid obesity ([place])   VTE plan: no chemical prophylaxis recommended ([email or web address])  Time coordinating discharge: 15 min

## 2021-02-15 ENCOUNTER — Telehealth: Payer: Self-pay | Admitting: Skilled Nursing Facility1

## 2021-02-15 NOTE — Telephone Encounter (Signed)
RD called pt to verify fluid intake once starting soft, solid proteins 2 week post-bariatric surgery.   Daily Fluid intake:64 oz Daily Protein intake: 60 g  Concerns/issues:   None stated.

## 2021-02-25 DIAGNOSIS — Z713 Dietary counseling and surveillance: Secondary | ICD-10-CM | POA: Diagnosis not present

## 2021-02-25 DIAGNOSIS — R7303 Prediabetes: Secondary | ICD-10-CM | POA: Diagnosis not present

## 2021-02-25 DIAGNOSIS — Z85850 Personal history of malignant neoplasm of thyroid: Secondary | ICD-10-CM | POA: Diagnosis not present

## 2021-02-25 DIAGNOSIS — E282 Polycystic ovarian syndrome: Secondary | ICD-10-CM | POA: Diagnosis not present

## 2021-02-25 DIAGNOSIS — E890 Postprocedural hypothyroidism: Secondary | ICD-10-CM | POA: Diagnosis not present

## 2021-03-24 ENCOUNTER — Encounter: Payer: Federal, State, Local not specified - PPO | Attending: General Surgery | Admitting: Skilled Nursing Facility1

## 2021-03-24 ENCOUNTER — Other Ambulatory Visit: Payer: Self-pay

## 2021-03-24 DIAGNOSIS — E669 Obesity, unspecified: Secondary | ICD-10-CM | POA: Insufficient documentation

## 2021-03-24 NOTE — Progress Notes (Addendum)
Bariatric Nutrition Follow-Up Visit Medical Nutrition Therapy    NUTRITION ASSESSMENT    Anthropometrics  Surgery date: 01/26/2021 Surgery type: RYGB Start weight at [facility]: 260.2 Weight today: 236.6 lbs   Body Composition Scale 02/09/2021 03/24/2021  Current Body Weight 251.1 236.6  Total Body Fat % 45.2 43.8  Visceral Fat 16 15  Fat-Free Mass % 54.7 56.1   Total Body Water % 41.8 42.5  Muscle-Mass lbs 32.1 31.8  BMI 41 38.6  Body Fat Displacement           Torso  lbs 70.3 64.3         Left Leg  lbs 14 12.8         Right Leg  lbs 14 12.8         Left Arm  lbs 7 6.4         Right Arm   lbs 7 6.4   Clinical  Medical hx: Medications: Labs:    Lifestyle & Dietary Hx  Pt states for the last 2 weeks she has been vomiting; pt states prior to surgery she never did eat meat and has tried bear and alligator.  Pt states cheddar soup and brothy clam soup went well.  Pt states all fluids down well without issue. Pt state she has tried a large variety of protein shakes which caused bloat and diarrhea and cramps. Pt states she forgot she was allergic to whey back when she was in her 20-30's. Pt states she was eating eggs the first 2 weeks which was fine but the next 2 weeks was not.  Pt states lentils, white beans go well black beans do not.  Pt states plant based proteins: impossible, beyond, morningstar all sat like a log in her stomach also tried a Administrator and another restaurant. Pt states she does not eat beef at all. Pt state she does not eat mayonnaise.   Pt states yesterday was really bad stating she ate egg salad.  Pt states she does take her calcium 4 hours from her thyroid medicine.   Pt states for everything she knows immediatly what is causing issues other than the plant based foods which do not cause vomiting just does not seem to digest.   Food allergies: chocolate, tomato, red die 9 and 6, egg, BBQ sauce, swiss chard, spaghetti squash, soda, whey  Tolerated: Protein  peanut butter Cheddar soup (homade) Minced Clam chowder (no potato) + fair life milk Red and green lentils cheese  Try:  Sardines makeral Nuts Seeds Sunbutter Almond butter    Estimated daily fluid intake: unknown oz Estimated daily protein intake: unknown g Supplements: multi and calcium Current average weekly physical activity: aqua aerobics 4 times a week  24-Hr Dietary Recall First Meal:  Snack:  Second Meal: Snack:  Third Meal: Snack: Beverages: hot tea, water  Post-Op Goals/ Signs/ Symptoms Using straws: no Drinking while eating: no Chewing/swallowing difficulties: no Changes in vision: no Changes to mood/headaches: no Hair loss/changes to skin/nails: no Difficulty focusing/concentrating: no Sweating: no Dizziness/lightheadedness: no Palpitations: no  Carbonated/caffeinated beverages: no N/V/D/C/Gas: no Abdominal pain: no Dumping syndrome: no    NUTRITION DIAGNOSIS  Overweight/obesity ([place]-3.3) related to past poor dietary habits and physical inactivity as evidenced by completed bariatric surgery and following dietary guidelines for continued weight loss and healthy nutrition status.     NUTRITION INTERVENTION Nutrition counseling (C-1) and education (E-2) to facilitate bariatric surgery goals, including: . The importance of consuming adequate calories as well as certain nutrients  daily due to the body's need for essential vitamins, minerals, and fats . The importance of daily physical activity and to reach a goal of at least 150 minutes of moderate to vigorous physical activity weekly (or as directed by their physician) due to benefits such as increased musculature and improved lab values . Shifting focus from bariatric surgery phases to avoidance of protein malnutrition and overall health/recovery  . The importance of the avoidance of foods allergic to such as eggs and whey . Educated pt on label reading within the context of allergan avoidance    Goals: Stop multi for 1 week until no more vomiting  When tummy is not feeling well sip on hot tea Start the Day with hot tea full stop  Read you labels looking for eggs and milk or whey Keep a log of symptoms and what you ate  Try:  Sardines makeral Any Nuts Seeds Sunbutter Almond butter Tofu Any vegetables   Do not freak out if a recipe calls for fruit, potato, or sweet potato   Handouts Provided Include   Recipes  Learning Style & Readiness for Change Teaching method utilized: Visual & Auditory  Demonstrated degree of understanding via: Teach Back  Readiness Level: Action Barriers to learning/adherence to lifestyle change: possible allergies   RD's Notes for Next Visit . Assess adherence to pt chosen goals   MONITORING & EVALUATION Dietary intake, weekly physical activity, body weight  Next Steps Patient is to follow-up in 1 month or sooner

## 2021-03-26 ENCOUNTER — Telehealth: Payer: Self-pay | Admitting: Skilled Nursing Facility1

## 2021-03-26 NOTE — Telephone Encounter (Signed)
Called to check in on pts progress.  LVM 

## 2021-03-26 NOTE — Telephone Encounter (Signed)
Opened in error

## 2021-04-07 DIAGNOSIS — R0683 Snoring: Secondary | ICD-10-CM | POA: Diagnosis not present

## 2021-04-07 DIAGNOSIS — R69 Illness, unspecified: Secondary | ICD-10-CM | POA: Diagnosis not present

## 2021-04-07 DIAGNOSIS — K912 Postsurgical malabsorption, not elsewhere classified: Secondary | ICD-10-CM | POA: Diagnosis not present

## 2021-04-07 DIAGNOSIS — E669 Obesity, unspecified: Secondary | ICD-10-CM | POA: Diagnosis not present

## 2021-04-07 DIAGNOSIS — E890 Postprocedural hypothyroidism: Secondary | ICD-10-CM | POA: Diagnosis not present

## 2021-04-07 DIAGNOSIS — Z9884 Bariatric surgery status: Secondary | ICD-10-CM | POA: Diagnosis not present

## 2021-04-15 DIAGNOSIS — R7309 Other abnormal glucose: Secondary | ICD-10-CM | POA: Diagnosis not present

## 2021-04-20 ENCOUNTER — Encounter: Payer: Federal, State, Local not specified - PPO | Attending: General Surgery | Admitting: Skilled Nursing Facility1

## 2021-04-20 ENCOUNTER — Other Ambulatory Visit: Payer: Self-pay

## 2021-04-20 DIAGNOSIS — E669 Obesity, unspecified: Secondary | ICD-10-CM | POA: Diagnosis not present

## 2021-04-20 NOTE — Progress Notes (Signed)
Bariatric Nutrition Follow-Up Visit Medical Nutrition Therapy    NUTRITION ASSESSMENT    Anthropometrics  Surgery date: 01/26/2021 Surgery type: RYGB Start weight at [facility]: 260.2 Weight today: 228.1 lbs   Body Composition Scale 02/09/2021 03/24/2021 04/20/2021  Current Body Weight 251.1 236.6 228.1  Total Body Fat % 45.2 43.8 42.9  Visceral Fat 16 15   Fat-Free Mass % 54.7 56.1    Total Body Water % 41.8 42.5 43  Muscle-Mass lbs 32.1 31.8   BMI 41 38.6   Body Fat Displacement            Torso  lbs 70.3 64.3          Left Leg  lbs 14 12.8          Right Leg  lbs 14 12.8          Left Arm  lbs 7 6.4          Right Arm   lbs 7 6.4    Clinical  Medical hx: Medications: Labs:    Lifestyle & Dietary Hx    Food allergies: chocolate, tomato, red die 9 and 6, egg, BBQ sauce, swiss chard, spaghetti squash, soda, whey  Tolerated: Protein peanut butter Cheddar soup (homade) Minced Clam chowder (no potato) + fair life milk Red and green lentils cheese Nut Seeds Sunbutter Almond butter Falafel unjury chicken soup  Any animal products including seafood and also tofu and soy products and protein supplements and milk and high fat foods: not tolerated   Pt states she does aqua aerobics. Pt states she threw up due to being rushed and stressed (Realizing gut brain connection: if stressed, overwhelmed, etc. Will have trouble keeping down food: is working on getting a less stressful job). Pt states she has really good days eating every 3 hours with about 2 ounces at a time. Pt states she is still not getting enough protein stating she thinks she needs to eliminate eggs also states she has not been tracking so unsure what she is getting in. Pt states quinoa and black eyed peas goes really well. Pt states hot tea in the morning really helps to settle her stomach. Pt states she really wants to like animal products and is very vexed she has to eat a vegetarian diet so has been trying  chicken every Sunday: Dietitian advised to stop trying chicken so she can have an entire month without vomiting: pt agreed to avoid this. Pt states she is anxious about gaining the weight back and not keeping her A1C and other labs WNL.   Pt does not tolerate fat.    Estimated daily fluid intake: unknown oz Estimated daily protein intake: unknown g Supplements: multi and calcium Current average weekly physical activity: aqua aerobics 4 times a week  24-Hr Dietary Recall First Meal: scrambled egg whites + black bean patty Snack:  Second Meal: quinoa + black eyed peas + broccoli  Snack:  Third Meal: cauliflower crust + cheese + spinach Snack: 2 sugar free popcicles + 6 dark cherries  Beverages: hot tea, water  Post-Op Goals/ Signs/ Symptoms Using straws: no Drinking while eating: no Chewing/swallowing difficulties: no Changes in vision: no Changes to mood/headaches: no Hair loss/changes to skin/nails: no Difficulty focusing/concentrating: no Sweating: no Dizziness/lightheadedness: no Palpitations: no  Carbonated/caffeinated beverages: no N/V/D/C/Gas: no Abdominal pain: no Dumping syndrome: no    NUTRITION DIAGNOSIS  Overweight/obesity ([place]-3.3) related to past poor dietary habits and physical inactivity as evidenced by completed bariatric surgery and following  dietary guidelines for continued weight loss and healthy nutrition status.     NUTRITION INTERVENTION Nutrition counseling (C-1) and education (E-2) to facilitate bariatric surgery goals, including: . The importance of consuming adequate calories as well as certain nutrients daily due to the body's need for essential vitamins, minerals, and fats . The importance of daily physical activity and to reach a goal of at least 150 minutes of moderate to vigorous physical activity weekly (or as directed by their physician) due to benefits such as increased musculature and improved lab values . Shifting focus from bariatric  surgery phases to avoidance of protein malnutrition and overall health/recovery  . The importance of the avoidance of foods allergic to such as eggs and whey . Educated pt on label reading within the context of allergan avoidance   Goals: Try oatmilk + protein Try the soy isolate protein powder cooked in foods Have 2 black bean pattys for breakfast  Have 2 servings of unjury chicken soup DAILY Bone broth Aim to eat 4 times a day with 15 grams of protein each time and single digits of fat  Handouts Provided Include   Recipes  Learning Style & Readiness for Change Teaching method utilized: Visual & Auditory  Demonstrated degree of understanding via: Teach Back  Readiness Level: Action Barriers to learning/adherence to lifestyle change: possible allergies   RD's Notes for Next Visit . Assess adherence to pt chosen goals   MONITORING & EVALUATION Dietary intake, weekly physical activity, body weight  Next Steps Patient is to follow-up in 1 month or sooner

## 2021-04-27 DIAGNOSIS — E890 Postprocedural hypothyroidism: Secondary | ICD-10-CM | POA: Diagnosis not present

## 2021-04-28 ENCOUNTER — Other Ambulatory Visit: Payer: Self-pay | Admitting: General Surgery

## 2021-04-29 ENCOUNTER — Other Ambulatory Visit: Payer: Self-pay

## 2021-04-29 ENCOUNTER — Non-Acute Institutional Stay
Admission: RE | Admit: 2021-04-29 | Discharge: 2021-04-29 | Disposition: A | Discharge status: Home / Self Care | Payer: Federal, State, Local not specified - PPO | Source: Ambulatory Visit | Attending: General Surgery | Admitting: General Surgery

## 2021-04-29 DIAGNOSIS — E860 Dehydration: Secondary | ICD-10-CM | POA: Diagnosis not present

## 2021-04-29 LAB — CBC WITH DIFFERENTIAL/PLATELET
Abs Immature Granulocytes: 0.01 10*3/uL (ref 0.00–0.07)
Basophils Absolute: 0 10*3/uL (ref 0.0–0.1)
Basophils Relative: 1 %
Eosinophils Absolute: 0.1 10*3/uL (ref 0.0–0.5)
Eosinophils Relative: 2 %
HCT: 40.8 % (ref 36.0–46.0)
Hemoglobin: 13.6 g/dL (ref 12.0–15.0)
Immature Granulocytes: 0 %
Lymphocytes Relative: 37 %
Lymphs Abs: 2.3 10*3/uL (ref 0.7–4.0)
MCH: 30.9 pg (ref 26.0–34.0)
MCHC: 33.3 g/dL (ref 30.0–36.0)
MCV: 92.7 fL (ref 80.0–100.0)
Monocytes Absolute: 0.4 10*3/uL (ref 0.1–1.0)
Monocytes Relative: 7 %
Neutro Abs: 3.3 10*3/uL (ref 1.7–7.7)
Neutrophils Relative %: 53 %
Platelets: 200 10*3/uL (ref 150–400)
RBC: 4.4 MIL/uL (ref 3.87–5.11)
RDW: 15 % (ref 11.5–15.5)
WBC: 6.2 10*3/uL (ref 4.0–10.5)
nRBC: 0 % (ref 0.0–0.2)

## 2021-04-29 LAB — COMPREHENSIVE METABOLIC PANEL
ALT: 12 U/L (ref 0–44)
AST: 29 U/L (ref 15–41)
Albumin: 4.1 g/dL (ref 3.5–5.0)
Alkaline Phosphatase: 91 U/L (ref 38–126)
Anion gap: 9 (ref 5–15)
BUN: 7 mg/dL (ref 6–20)
CO2: 25 mmol/L (ref 22–32)
Calcium: 9.4 mg/dL (ref 8.9–10.3)
Chloride: 103 mmol/L (ref 98–111)
Creatinine, Ser: 0.61 mg/dL (ref 0.44–1.00)
GFR, Estimated: >60 mL/min (ref >60)
Glucose, Bld: 105 mg/dL — ABNORMAL HIGH (ref 70–99)
Potassium: 4.1 mmol/L (ref 3.5–5.1)
Sodium: 137 mmol/L (ref 135–145)
Total Bilirubin: 0.5 mg/dL (ref 0.3–1.2)
Total Protein: 7.1 g/dL (ref 6.5–8.1)

## 2021-04-29 MED ORDER — ONDANSETRON 4 MG PO TBDP: 4.0000 mg | ORAL_TABLET | ORAL | Status: DC | PRN

## 2021-04-29 MED ORDER — ONDANSETRON HCL 4 MG/2ML IJ SOLN: 4.0000 mg | INTRAMUSCULAR | Status: DC | PRN

## 2021-04-29 MED ORDER — FAMOTIDINE IN NACL 20-0.9 MG/50ML-% IV SOLN
20.0000 mg | Freq: Two times a day (BID) | INTRAVENOUS | Status: DC
  Filled 2021-04-29 (×3): qty 50

## 2021-04-29 MED ORDER — SODIUM CHLORIDE 0.9 % IV BOLUS
1000.0000 mL | Freq: Once | INTRAVENOUS | Status: AC
  Administered 2021-04-29: 1000 mL via INTRAVENOUS

## 2021-04-29 MED ORDER — FAMOTIDINE IN NACL 20-0.9 MG/50ML-% IV SOLN
20.0000 mg | Freq: Two times a day (BID) | INTRAVENOUS | Status: DC
  Filled 2021-04-29: qty 50

## 2021-04-29 MED ORDER — THIAMINE HCL 100 MG/ML IJ SOLN
Freq: Once | INTRAVENOUS | Status: AC
  Filled 2021-04-29: qty 1000

## 2021-06-10 DIAGNOSIS — Z85850 Personal history of malignant neoplasm of thyroid: Secondary | ICD-10-CM | POA: Diagnosis not present

## 2021-06-10 DIAGNOSIS — E890 Postprocedural hypothyroidism: Secondary | ICD-10-CM | POA: Diagnosis not present

## 2021-06-11 ENCOUNTER — Other Ambulatory Visit: Payer: Self-pay | Admitting: General Surgery

## 2021-06-15 ENCOUNTER — Ambulatory Visit: Payer: Federal, State, Local not specified - PPO

## 2021-06-17 DIAGNOSIS — R112 Nausea with vomiting, unspecified: Secondary | ICD-10-CM | POA: Diagnosis not present

## 2021-06-17 DIAGNOSIS — G8918 Other acute postprocedural pain: Secondary | ICD-10-CM | POA: Diagnosis not present

## 2021-06-17 DIAGNOSIS — R109 Unspecified abdominal pain: Secondary | ICD-10-CM | POA: Diagnosis not present

## 2021-06-17 DIAGNOSIS — Z09 Encounter for follow-up examination after completed treatment for conditions other than malignant neoplasm: Secondary | ICD-10-CM | POA: Diagnosis not present

## 2021-06-18 ENCOUNTER — Ambulatory Visit
Admission: RE | Admit: 2021-06-18 | Discharge: 2021-06-18 | Disposition: A | Discharge status: Home / Self Care | Payer: Federal, State, Local not specified - PPO | Source: Ambulatory Visit | Attending: General Surgery | Admitting: General Surgery

## 2021-06-18 ENCOUNTER — Other Ambulatory Visit: Payer: Self-pay | Admitting: Student

## 2021-06-18 ENCOUNTER — Other Ambulatory Visit: Payer: Self-pay

## 2021-06-18 DIAGNOSIS — E860 Dehydration: Secondary | ICD-10-CM | POA: Diagnosis not present

## 2021-06-18 DIAGNOSIS — R112 Nausea with vomiting, unspecified: Secondary | ICD-10-CM | POA: Insufficient documentation

## 2021-06-18 MED ORDER — ONDANSETRON 4 MG PO TBDP: 4.0000 mg | ORAL_TABLET | ORAL | Status: DC | PRN

## 2021-06-18 MED ORDER — ONDANSETRON HCL 4 MG/2ML IJ SOLN: 4.0000 mg | INTRAMUSCULAR | Status: DC | PRN

## 2021-06-18 MED ORDER — ENSURE MAX PROTEIN PO LIQD
11.0000 [oz_av] | ORAL | Status: DC | PRN
  Filled 2021-06-18: qty 330

## 2021-06-18 MED ORDER — SODIUM CHLORIDE 0.9 % IV BOLUS
2000.0000 mL | Freq: Once | INTRAVENOUS | Status: AC
  Administered 2021-06-18: 2000 mL via INTRAVENOUS

## 2021-06-18 NOTE — OR Nursing (Signed)
Patient ambulatory to SDS for IV fluid rehydration and patient thought for a bag of protein fluid.  She did not have an order for the Banana Bag, just a 2 liter normal saline bolus.  Patient was frustrated that she was not to receive the banana bag since she has not been able to keep anything down, especially anything with protein.  She does not have a lot of energy and feels like her complexion is fading.  Reviewed many options for food alterations in her diet.  Patient to see  gastroenterologist next week and to see her PCP to come up with a plan to help her feel better.

## 2021-06-18 NOTE — Discharge Instructions (Signed)
Patient verbally instructed to contact her PCP should she become light headed or dizzy or have vomiting that does not stop. She states an awareness of what she needs to look out for and has been in contact with her PCP today and her bariatric surgical office.

## 2021-07-15 DIAGNOSIS — Z9884 Bariatric surgery status: Secondary | ICD-10-CM | POA: Diagnosis not present

## 2021-07-15 DIAGNOSIS — E6609 Other obesity due to excess calories: Secondary | ICD-10-CM | POA: Diagnosis not present

## 2021-07-15 DIAGNOSIS — Z6833 Body mass index (BMI) 33.0-33.9, adult: Secondary | ICD-10-CM | POA: Diagnosis not present

## 2021-07-28 ENCOUNTER — Ambulatory Visit: Payer: Federal, State, Local not specified - PPO | Admitting: Gastroenterology

## 2021-07-28 ENCOUNTER — Encounter: Payer: Self-pay | Admitting: Gastroenterology

## 2021-07-28 VITALS — BP 124/72 | HR 66 | Ht 65.5 in | Wt 202.0 lb

## 2021-07-28 DIAGNOSIS — R140 Abdominal distension (gaseous): Secondary | ICD-10-CM

## 2021-07-28 DIAGNOSIS — R112 Nausea with vomiting, unspecified: Secondary | ICD-10-CM

## 2021-07-28 NOTE — Patient Instructions (Signed)
If you are age 53 or older, your body mass index should be between 23-30. Your Body mass index is 33.1 kg/m. If this is out of the aforementioned range listed, please consider follow up with your Primary Care Provider.  If you are age 78 or younger, your body mass index should be between 19-25. Your Body mass index is 33.1 kg/m. If this is out of the aformentioned range listed, please consider follow up with your Primary Care Provider.   __________________________________________________________  The [facility] providers would like to encourage you to use [facility] to communicate with providers for non-urgent requests or questions.  Due to long hold times on the telephone, sending your provider a message by [facility] may be a faster and more efficient way to get a response.  Please allow 48 business hours for a response.  Please remember that this is for non-urgent requests.    You have been scheduled for an endoscopy. Please follow written instructions given to you at your visit today. If you use inhalers (even only as needed), please bring them with you on the day of your procedure.   It was a pleasure to see you today!  Thank you for trusting me with your gastrointestinal care!

## 2021-07-28 NOTE — Progress Notes (Signed)
[facility] Consult Note:  History: Erika Lambert 07/28/2021  Referring provider: Gurney Maxin, MD  Reason for consult/chief complaint: Gastric bypass issues (Can't tolerate animal protein/whey. Last week ate alittle chicken, threw it up, foam came up first. Has had issues with vomiting, and bloats and has cramping)   Subjective  HPI: Erika Lambert was referred by Dr. 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 Most recent office note by Dr. Kieth Brightly on 07/19/2021 indicates patient has had continued issues with dehydration requiring IV fluids every 2 weeks.  Erika Lambert says she had problems immediately after the surgery (even before discharge) with abdominal bloating and vomiting.  She spoke to her surgeon and a dietitian and I was reassured these would improve over time.  She feels as if she cannot tolerate any animal proteins or some protein calorie drinks (and was told this was presumably because of allergy or intolerance to the whey component). If she tries to eat any meat, within 10 minutes she will feel the production of foam coming up and then she will bring those foods back up.  Starches are easier to tolerate, but she is still troubled by bloating and gas.  She feels that she gets dehydrated at times and found that infusion center that would give her fluids every couple of weeks. Bowel habits somewhat irregular since the bypass surgery.  Denies rectal bleeding   ROS:  Review of Systems  Constitutional:  Positive for fatigue. Negative for appetite change and unexpected weight change.  HENT:  Negative for mouth sores and voice change.   Eyes:  Negative for pain and redness.  Respiratory:  Negative for cough and shortness of breath.   Cardiovascular:  Negative for chest pain and palpitations.  Genitourinary:  Negative for dysuria and hematuria.   Musculoskeletal:  Positive for back pain. Negative for arthralgias and myalgias.  Skin:  Negative for pallor and rash.  Allergic/Immunologic: Positive for environmental allergies.  Neurological:  Positive for headaches. Negative for weakness.  Hematological:  Negative for adenopathy.    Past Medical History: Past Medical History:  Diagnosis Date   Anxiety    Arthritis    Asthma 2016   environmentla asthma    Cancer ([place]) 2009   thyroid   GERD (gastroesophageal reflux disease)    Headache    Hypothyroidism    Obesity    Pneumonia    hx of    Pre-diabetes    Sleep apnea    no longer uses cpap      Past Surgical History: Past Surgical History:  Procedure Laterality Date   BARIATRIC SURGERY     BREAST SURGERY Bilateral 2005   lift and skin removed   BUNIONECTOMY Left    [place]     for skin iremoval due to weight loss    GASTRIC ROUX-EN-Y N/A 01/26/2021   Procedure: LAPAROSCOPIC ROUX-EN-Y GASTRIC BYPASS WITH UPPER ENDOSCOPY;  Surgeon: Kieth Brightly, [name], MD;  Location: [facility];  Service: General;  Laterality: N/A;   TOTAL HIP ARTHROPLASTY Right 04/22/2020   Procedure: TOTAL HIP ARTHROPLASTY ANTERIOR APPROACH [place];  Surgeon: Gaynelle Arabian, MD;  Location: [facility];  Service: Orthopedics;  Laterality: Right;  125mn   TOTAL THYROIDECTOMY  2009   UPPER GI ENDOSCOPY N/A 01/26/2021   Procedure: UPPER GI ENDOSCOPY;  Surgeon: KKieth BrightlyLArta Bruce MD;  Location:  [facility];  Service: General;  Laterality: N/A;     Family History: Family History  Adopted: Yes    Social History: Social History   Socioeconomic History   Marital status: Married    Spouse name: Not on file   Number of children: 5   Years of education: Not on file   Highest education level: Not on file  Occupational History   Not on file  Tobacco Use   Smoking status: Never   Smokeless tobacco: Never  Vaping Use   Vaping Use: Never used  Substance  and Sexual Activity   Alcohol use: Never   Drug use: Never   Sexual activity: Not on file  Other Topics Concern   Not on file  Social History Narrative   Not on file   Social Determinants of Health   Financial Resource Strain: Not on file  Food Insecurity: No Food Insecurity   Worried About Running Out of Food in the Last Year: Never true   Ran Out of Food in the Last Year: Never true  Transportation Needs: Not on file  Physical Activity: Not on file  Stress: Not on file  Social Connections: Not on file    Allergies: Allergies  Allergen Reactions   Latex Other (See Comments)    welts   Red Dye Nausea Only and Other (See Comments)    Unable to tolerate any foods with red dye.  Stomach pains   Whey Other (See Comments)    Severe bloating. Unable to tolerate any foods with whey   Albuterol Hives   Penicillins Hives and Other (See Comments)    Dizziness Tolerated Cephalosporin Date: 04/23/20.     Outpatient Meds: Current Outpatient Medications  Medication Sig Dispense Refill   acetaminophen (TYLENOL) 500 MG tablet Take 500 mg by mouth every 6 (six) hours as needed (pain).     diphenhydrAMINE (BENADRYL) 25 mg capsule Take 25 mg by mouth at bedtime as needed for allergies.     levocetirizine (XYZAL) 5 MG tablet Take 5 mg by mouth at bedtime as needed for allergies.     levothyroxine (SYNTHROID) 112 MCG tablet Take 1 tablet by mouth. Takes Mon - Friday     levothyroxine (SYNTHROID) 125 MCG tablet Take 125 mcg by mouth. Takes Sat and Sunday     Multiple Vitamin (MULTIVITAMIN WITH MINERALS) TABS tablet Take 1 tablet by mouth daily at 6 PM. Centrum Silver for Women     omeprazole (PRILOSEC) 20 MG capsule Take 1 capsule (20 mg total) by mouth 2 (two) times daily. 20 capsule 0   No current facility-administered medications for this visit.      ___________________________________________________________________ Objective   Exam:  BP 124/72   Pulse 66   Ht 5' 5.5" (1.664  m)   Wt 202 lb (91.6 kg)   BMI 33.10 kg/m  Wt Readings from Last 3 Encounters:  07/28/21 202 lb (91.6 kg)  04/20/21 228 lb 1.6 oz (103.5 kg)  02/09/21 251 lb 1.6 oz (113.9 kg)    General: Well-appearing, good muscle mass, looks well-hydrated Eyes: sclera anicteric, no redness ENT: oral mucosa moist without lesions, no cervical or supraclavicular lymphadenopathy CV: RRR without murmur, S1/S2, no JVD, no peripheral edema Resp: clear to auscultation bilaterally, normal RR and effort noted GI: soft, no tenderness, with active bowel sounds. No guarding or palpable organomegaly noted. Skin; warm and dry, no rash or jaundice noted Neuro: awake, alert and oriented x 3. Normal gross motor function and fluent speech  Labs:  CBC Latest Ref Rng & Units 04/29/2021 01/28/2021 01/27/2021  WBC 4.0 - 10.5 K/uL 6.2 13.6(H) 18.8(H)  Hemoglobin 12.0 - 15.0 g/dL 13.6 10.9(L) 12.8  Hematocrit 36.0 - 46.0 % 40.8 33.9(L) 38.9  Platelets 150 - 400 K/uL 200 167 218   CMP Latest Ref Rng & Units 04/29/2021 01/27/2021 01/14/2021  Glucose 70 - 99 mg/dL 105(H) 159(H) 94  BUN 6 - 20 mg/dL '7 9 20  '$ Creatinine 0.44 - 1.00 mg/dL 0.61 0.72 0.77  Sodium 135 - 145 mmol/L 137 130(L) 135  Potassium 3.5 - 5.1 mmol/L 4.1 4.1 4.1  Chloride 98 - 111 mmol/L 103 98 100  CO2 22 - 32 mmol/L 25 20(L) 26  Calcium 8.9 - 10.3 mg/dL 9.4 8.8(L) 9.4  Total Protein 6.5 - 8.1 g/dL 7.1 7.2 7.9  Total Bilirubin 0.3 - 1.2 mg/dL 0.5 0.7 0.8  Alkaline Phos 38 - 126 U/L 91 61 75  AST 15 - 41 U/L 29 35 27  ALT 0 - 44 U/L '12 14 14     '$ Radiologic Studies:  Normal presurgical upper GI series on 12/10/2020  Assessment: Encounter Diagnoses  Name Primary?   Non-intractable vomiting with nausea, unspecified vomiting type Yes   Abdominal bloating     Erika Lambert has had persistent symptoms of vomiting and bloating since her bariatric surgery 6 months ago.  Some of it has slowly improved but she still has difficulty.  She wondered if this may be  somehow an allergy to certain things, but that I did do not think that is the case.  This is likely to be a factor the anatomic change with a small gastric pouch that has little to no motility, less likely a gastro jejunal anastomotic stricture.  Bloating and gas are also likely some maldigestion related to the altered intestinal anatomy, which occurs more commonly with carbohydrates than protein.  Plan: Upper endoscopy to rule out anastomotic stricture.  She was agreeable after discussion of procedure and risks  The benefits and risks of the planned procedure were described in detail with the patient or (when appropriate) their health care proxy.  Risks were outlined as including, but not limited to, bleeding, infection, perforation, adverse medication reaction leading to cardiac or pulmonary decompensation, pancreatitis (if ERCP).  The limitation of incomplete mucosal visualization was also discussed.  No guarantees or warranties were given.  Hopefully more this will slowly improve over time as her intestinal system accommodates to its new anatomy. She plans to revisit dietary advice with the bariatric dietitian.  Thank you for the courtesy of this consult.  Please call me with any questions or concerns.  Nelida Meuse III  CC: Referring provider noted above

## 2021-08-05 ENCOUNTER — Other Ambulatory Visit: Payer: Self-pay

## 2021-08-05 ENCOUNTER — Encounter: Payer: Federal, State, Local not specified - PPO | Attending: General Surgery | Admitting: Skilled Nursing Facility1

## 2021-08-05 DIAGNOSIS — E890 Postprocedural hypothyroidism: Secondary | ICD-10-CM | POA: Diagnosis not present

## 2021-08-05 DIAGNOSIS — Z85850 Personal history of malignant neoplasm of thyroid: Secondary | ICD-10-CM | POA: Diagnosis not present

## 2021-08-05 NOTE — Progress Notes (Signed)
Bariatric Nutrition Follow-Up Visit Medical Nutrition Therapy    NUTRITION ASSESSMENT    Anthropometrics  Surgery date: 01/26/2021 Surgery type: RYGB Start weight at [facility]: 260.2 Weight today: 228.1 lbs   Body Composition Scale 02/09/2021 03/24/2021 04/20/2021 08/05/2021  Current Body Weight 251.1 236.6 228.1 202.3  Total Body Fat % 45.2 43.8 42.9 39.8  Visceral Fat '16 15  12  '$ Fat-Free Mass % 54.7 56.1  60.1   Total Body Water % 41.8 42.5 43 44.5  Muscle-Mass lbs 32.1 31.8  31.1  BMI 41 38.6  33.3  Body Fat Displacement             Torso  lbs 70.3 64.3  49.9         Left Leg  lbs 14 12.8  9.9         Right Leg  lbs 14 12.8  9.9         Left Arm  lbs 7 6.4  4.9         Right Arm   lbs 7 6.4  4.9   Clinical  Medical hx: Medications: Labs:    Lifestyle & Dietary Hx    Food allergies: chocolate, tomato, red die 9 and 6, egg, BBQ sauce, swiss chard, spaghetti squash, soda, whey  Tolerated: Protein peanut butter Red and green lentils cheese Nut Seeds Sunbutter Almond butter Falafel unjury chicken soup Pork Dark meat chicken Carrots  Corn   Any seafood and also tofu and soy products and protein supplements and milk and high fat foods: not tolerated  Pt does not tolerate fat.   Pt states her thyroid medication dose has changed. Pt states she was getting fluids weekly and then every other week and now every 3 weeks due to her feeling better. Pt states in the last 45 days she has only vomited once. Pt state she will get an endoscopy upcoming with a gastroenterology. Pt states she still feels bloated and gassey. Pt states she ate an atkins frozen meal the other day that went really well still gassey though.  Pt states her hair is not falling out like it was. Pt states if she eats a prune she will have a bowel movement, stating she does not eat it every day due to the sugar content: Dietitian advised pt to eat the prune daily in order to have healthy bowel movements.    Estimated daily fluid intake: 64 oz Estimated daily protein intake: unknown g Supplements: multi and calcium Current average weekly physical activity: aqua aerobics 4 times a week  24-Hr Dietary Recall First Meal: 1 whole scrambled egg   Snack: nuts Second Meal: atkins dinner or meat and cheese roll up Snack:  Third Meal: veal or pork or chicken + broccoli or cabbage Snack: 2 sugar free popcicles  or pretzel thins Beverages: hot tea, water, warm water  Post-Op Goals/ Signs/ Symptoms Using straws: no Drinking while eating: no Chewing/swallowing difficulties: no Changes in vision: no Changes to mood/headaches: no Hair loss/changes to skin/nails: no Difficulty focusing/concentrating: no Sweating: no Dizziness/lightheadedness: no Palpitations: no  Carbonated/caffeinated beverages: no N/V/D/C/Gas: every other day Abdominal pain: no Dumping syndrome: no    NUTRITION DIAGNOSIS  Overweight/obesity (English-3.3) related to past poor dietary habits and physical inactivity as evidenced by completed bariatric surgery and following dietary guidelines for continued weight loss and healthy nutrition status.     NUTRITION INTERVENTION: continued Nutrition counseling (C-1) and education (E-2) to facilitate bariatric surgery goals, including: The importance of consuming adequate calories  as well as certain nutrients daily due to the body's need for essential vitamins, minerals, and fats The importance of daily physical activity and to reach a goal of at least 150 minutes of moderate to vigorous physical activity weekly (or as directed by their physician) due to benefits such as increased musculature and improved lab values Shifting focus from bariatric surgery phases to avoidance of protein malnutrition and overall health/recovery  The importance of the avoidance of foods allergic to such as eggs and whey Educated pt on label reading within the context of allergan avoidance   Goals: -follow  the needs of your body always ensuring you are hitting your protein and fluid goals  Handouts Provided Include  Recipes  Learning Style & Readiness for Change Teaching method utilized: Visual & Auditory  Demonstrated degree of understanding via: Teach Back  Readiness Level: Action Barriers to learning/adherence to lifestyle change: possible allergies   RD's Notes for Next Visit Assess adherence to pt chosen goals   MONITORING & EVALUATION Dietary intake, weekly physical activity, body weight  Next Steps Patient is to follow-up in December

## 2021-08-09 ENCOUNTER — Other Ambulatory Visit: Payer: Self-pay

## 2021-08-09 ENCOUNTER — Encounter: Payer: Self-pay | Admitting: Gastroenterology

## 2021-08-09 ENCOUNTER — Ambulatory Visit (AMBULATORY_SURGERY_CENTER): Payer: Federal, State, Local not specified - PPO | Admitting: Gastroenterology

## 2021-08-09 VITALS — BP 105/79 | HR 64 | Temp 96.0°F | Resp 15 | Ht 65.0 in | Wt 202.0 lb

## 2021-08-09 DIAGNOSIS — R112 Nausea with vomiting, unspecified: Secondary | ICD-10-CM | POA: Diagnosis not present

## 2021-08-09 MED ORDER — SODIUM CHLORIDE 0.9 % IV SOLN: 500.0000 mL | Freq: Once | INTRAVENOUS | Status: DC

## 2021-08-09 NOTE — Progress Notes (Deleted)
PT taken to PACU. Monitors in place. VSS. Report given to RN. 

## 2021-08-09 NOTE — Patient Instructions (Signed)
Discharge instructions given. Normal exam. Resume previous medications. YOU HAD AN ENDOSCOPIC PROCEDURE TODAY AT [facility]:   Refer to the procedure report that was given to you for any specific questions about what was found during the examination.  If the procedure report does not answer your questions, please call your gastroenterologist to clarify.  If you requested that your care partner not be given the details of your procedure findings, then the procedure report has been included in a sealed envelope for you to review at your convenience later.  YOU SHOULD EXPECT: Some feelings of bloating in the abdomen. Passage of more gas than usual.  Walking can help get rid of the air that was put into your GI tract during the procedure and reduce the bloating. If you had a lower endoscopy (such as a colonoscopy or flexible sigmoidoscopy) you may notice spotting of blood in your stool or on the toilet paper. If you underwent a bowel prep for your procedure, you may not have a normal bowel movement for a few days.  Please Note:  You might notice some irritation and congestion in your nose or some drainage.  This is from the oxygen used during your procedure.  There is no need for concern and it should clear up in a day or so.  SYMPTOMS TO REPORT IMMEDIATELY:  Following upper endoscopy (EGD)  Vomiting of blood or coffee ground material  New chest pain or pain under the shoulder blades  Painful or persistently difficult swallowing  New shortness of breath  Fever of 100F or higher  Black, tarry-looking stools  For urgent or emergent issues, a gastroenterologist can be reached at any hour by calling [phone]. Do not use MyChart messaging for urgent concerns.    DIET:  We do recommend a small meal at first, but then you may proceed to your regular diet.  Drink plenty of fluids but you should avoid alcoholic beverages for 24 hours.  ACTIVITY:  You should plan to take it easy  for the rest of today and you should NOT DRIVE or use heavy machinery until tomorrow (because of the sedation medicines used during the test).    FOLLOW UP: Our staff will call the number listed on your records 48-72 hours following your procedure to check on you and address any questions or concerns that you may have regarding the information given to you following your procedure. If we do not reach you, we will leave a message.  We will attempt to reach you two times.  During this call, we will ask if you have developed any symptoms of COVID 19. If you develop any symptoms (ie: fever, flu-like symptoms, shortness of breath, cough etc.) before then, please call [phone].  If you test positive for Covid 19 in the 2 weeks post procedure, please call and report this information to [place].    If any biopsies were taken you will be contacted by phone or by letter within the next 1-3 weeks.  Please call us at [phone] if you have not heard about the biopsies in 3 weeks.    SIGNATURES/CONFIDENTIALITY: You and/or your care partner have signed paperwork which will be entered into your electronic medical record.  These signatures attest to the fact that that the information above on your After Visit Summary has been reviewed and is understood.  Full responsibility of the confidentiality of this discharge information lies with you and/or your care-partner.

## 2021-08-09 NOTE — Progress Notes (Signed)
VS completed by DT.    Medical history reviewed and updated.  

## 2021-08-09 NOTE — Progress Notes (Signed)
No changes to clinical history since GI office visit on 07/28/21.  The patient is appropriate for an endoscopic procedure in the ambulatory setting.

## 2021-08-09 NOTE — Progress Notes (Signed)
Pt in recovery with monitors in place, VSS. Report given to receiving RN. Bite guard was placed with pt awake to ensure comfort. No dental or soft tissue damage noted. 

## 2021-08-09 NOTE — Op Note (Signed)
[facility] Patient Name: Erika Lambert Procedure Date: 08/09/2021 3:58 PM MRN: [id number] Endoscopist: [place]. Loletha Carrow , MD Age: 55 Referring MD:  Date of Birth: 1966-01-07 Gender: Female Account #: [id number] Procedure:                Upper GI endoscopy Indications:              Vomiting (since gastric bypass) Medicines:                Monitored Anesthesia Care Procedure:                Pre-Anesthesia Assessment:                           - Prior to the procedure, a History and Physical                            was performed, and patient medications and                            allergies were reviewed. The patient's tolerance of                            previous anesthesia was also reviewed. The risks                            and benefits of the procedure and the sedation                            options and risks were discussed with the patient.                            All questions were answered, and informed consent                            was obtained. Prior Anticoagulants: The patient has                            taken no previous anticoagulant or antiplatelet                            agents. ASA Grade Assessment: II - A patient with                            mild systemic disease. After reviewing the risks                            and benefits, the patient was deemed in                            satisfactory condition to undergo the procedure.                           After obtaining informed consent, the endoscope was  passed under direct vision. Throughout the                            procedure, the patient's blood pressure, pulse, and                            oxygen saturations were monitored continuously. The                            GIF [id number] was introduced through the                            mouth, and advanced to the jejunum. The upper GI                            endoscopy was accomplished  without difficulty. The                            patient tolerated the procedure well. Scope In: Scope Out: Findings:                 The esophagus was normal.                           Evidence of a Roux-en-Y gastrojejunostomy was                            found. The gastrojejunal anastomosis was                            characterized by healthy appearing mucosa. This was                            traversed. The pouch-to-jejunum limb was                            characterized by healthy appearing mucosa. Roux                            limb deeply intubated and normal (no dilatation or                            retained material). There was also a 5-7cm blind                            limb of jejunum just distal to the GJ anastomosis                            (to the "left" after passing scope through                            anastomosis).                           The examined jejunum was normal. Complications:  No immediate complications. Estimated Blood Loss:     Estimated blood loss: none. Impression:               - Normal esophagus.                           - Roux-en-Y gastrojejunostomy with gastrojejunal                            anastomosis characterized by healthy appearing                            mucosa.                           - Normal examined jejunum.                           - No specimens collected.                           Normal gastric pouch size, no anastomotic stricture. Recommendation:           - Patient has a contact number available for                            emergencies. The signs and symptoms of potential                            delayed complications were discussed with the                            patient. Return to normal activities tomorrow.                            Written discharge instructions were provided to the                            patient.                           - Resume previous diet.                            - Continue present medications.                           - Return to my office PRN. Continue working with                            dietician regarding foods tolerated with this type                            of post-surgical upper GI anatomy. [name] L. Loletha Carrow, MD 08/09/2021 4:27:45 PM This report has been signed electronically.

## 2021-08-11 ENCOUNTER — Telehealth: Payer: Self-pay

## 2021-08-11 NOTE — Telephone Encounter (Signed)
NO ANSWER, MESSAGE LEFT FOR PATIENT. 

## 2021-08-11 NOTE — Telephone Encounter (Signed)
  Follow up Call-  Call back number 08/09/2021  Post procedure Call Back phone  # [phone]  Permission to leave phone message Yes     Patient questions:  Do you have a fever, pain , or abdominal swelling? No. Pain Score  0 *  Have you tolerated food without any problems? Yes.    Have you been able to return to your normal activities? Yes.    Do you have any questions about your discharge instructions: Diet   No. Medications  No. Follow up visit  No.  Do you have questions or concerns about your Care? No.  Actions: * If pain score is 4 or above: No action needed, pain <4. Have you developed a fever since your procedureno  2.   Have you had an respiratory symptoms (SOB or cough) since your procedure? no  3.   Have you tested positive for COVID 19 since your procedure no  4.   Have you had any family members/close contacts diagnosed with the COVID 19 since your procedure?  no   If yes to any of these questions please route to Joylene John, RN and Joella Prince, RN

## 2021-11-11 ENCOUNTER — Ambulatory Visit: Payer: Federal, State, Local not specified - PPO | Admitting: Skilled Nursing Facility1

## 2021-12-13 ENCOUNTER — Ambulatory Visit: Payer: Federal, State, Local not specified - PPO | Admitting: Skilled Nursing Facility1

## 2022-01-10 ENCOUNTER — Other Ambulatory Visit: Payer: Self-pay

## 2022-01-10 ENCOUNTER — Encounter: Payer: Federal, State, Local not specified - PPO | Attending: General Surgery | Admitting: Skilled Nursing Facility1

## 2022-01-10 DIAGNOSIS — E669 Obesity, unspecified: Secondary | ICD-10-CM | POA: Diagnosis not present

## 2022-01-10 NOTE — Progress Notes (Signed)
Bariatric Nutrition Follow-Up Visit Medical Nutrition Therapy    NUTRITION ASSESSMENT    Anthropometrics  Surgery date: 01/26/2021 Surgery type: RYGB Start weight at [facility]: 260.2 Weight today: 180.8 lbs   Body Composition Scale 02/09/2021 03/24/2021 04/20/2021 08/05/2021 01/10/2022  Current Body Weight 251.1 236.6 228.1 202.3 180.8  Total Body Fat % 45.2 43.8 42.9 39.8 36.3  Visceral Fat 16 15  12 10   Fat-Free Mass % 54.7 56.1  60.1 63.6   Total Body Water % 41.8 42.5 43 44.5 46.3  Muscle-Mass lbs 32.1 31.8  31.1 30.9  BMI 41 38.6  33.3 29.7  Body Fat Displacement              Torso  lbs 70.3 64.3  49.9 40.6         Left Leg  lbs 14 12.8  9.9 8.1         Right Leg  lbs 14 12.8  9.9 8.1         Left Arm  lbs 7 6.4  4.9 4         Right Arm   lbs 7 6.4  4.9 4   Clinical  Medical hx: Medications: Labs:    Lifestyle & Dietary Hx    Food allergies: chocolate, tomato, red die 9 and 6, egg, BBQ sauce, swiss chard, spaghetti squash, soda, whey  Tolerated: Protein peanut butter Red and green lentils cheese Nut Seeds Sunbutter Almond butter Falafel unjury chicken soup Pork Dark meat chicken Carrots  Corn   Pt states she does have some chest pressure and pain. Pt states a plum caused bloating, gas, chest pressure. Pt states sweet things she does have problems with and CAN tolerate 5-10 grapes. Pt states she knows fried foods do not do well. Pt states she drinks protein shakes due to chocolate cravings or crunch cravings.  Pt states she typically has a bowel movement  every 3 days.  Pt states she wants to try fasting (not eating until 12) due to her friends having tried it.   Pt states she often Wants to eat something (a craving) but does not know what it is because she wants a crunch or some such. Pt states she thinks she was Sneaking eating hiding from her son and husband. Pt states she has been Feeling overly emotional and experiencing food fear with her foods and is  starting to realize she might have a problem (referencing her relationship with food).  Due to pts concerns with her food fears and anxieties she would like to work with an eating disorder specialist: Dietitian will have pt follow up with [facility] and advised pt to find a therapist she feels a good rapport with and can help her with her food thoughts.    Pt states she is a very analytical and logical person so having this overly fraught relationship is very foreign and uncomfortable    Pt seems to be very aware of certain unhealthy aspects of her food   Pt completed the Eat-26 screening: scoring over 20 indicating disordered eating patterns and a need to work with a specialist    Estimated daily fluid intake: 64 oz Estimated daily protein intake: unknown g Supplements: multi and calcium Current average weekly physical activity: aqua aerobics 4 times a week  24-Hr Dietary Recall First Meal: 1 whole scrambled egg  + cheese + 2 rice cakes + half plum (half plum did not go well) or cream of wheat Snack: nuts Second Meal:  atkins dinner or meat and cheese roll up or meatloaf  + onion + pepper Snack:  Third Meal: veal or pork or chicken + broccoli or cabbage or egg + [place] sausage + half of toast Snack: 2 sugar free popcicles  or pretzel thins Beverages: hot tea, water, warm water  Post-Op Goals/ Signs/ Symptoms Using straws: no Drinking while eating: no Chewing/swallowing difficulties: no Changes in vision: no Changes to mood/headaches: no Hair loss/changes to skin/nails: no Difficulty focusing/concentrating: no Sweating: no Dizziness/lightheadedness: no Palpitations: no  Carbonated/caffeinated beverages: no N/V/D/C/Gas: every other day Abdominal pain: no Dumping syndrome: no    NUTRITION DIAGNOSIS  Overweight/obesity ([place]-3.3) related to past poor dietary habits and physical inactivity as evidenced by completed bariatric surgery and following dietary guidelines for  continued weight loss and healthy nutrition status.     NUTRITION INTERVENTION: continued Nutrition counseling (C-1) and education (E-2) to facilitate bariatric surgery goals, including: Educated pt on finding the right fit for her disordered eating patterns and avoiding triggers such as diet conversations with friends and the scale  Goals: -follow the needs of your body always ensuring you are hitting your overall nutrition goals  Handouts Provided Include    Learning Style & Readiness for Change Teaching method utilized: Visual & Auditory  Demonstrated degree of understanding via: Teach Back  Readiness Level: Action Barriers to learning/adherence to lifestyle change: disordered eating patterns  RD's Notes for Next Visit Assess adherence to pt chosen goals   MONITORING & EVALUATION Dietary intake, weekly physical activity, body weight  Next Steps Patient is to follow-up in December

## 2022-01-19 DIAGNOSIS — E890 Postprocedural hypothyroidism: Secondary | ICD-10-CM | POA: Diagnosis not present

## 2022-01-27 DIAGNOSIS — F5082 Avoidant/restrictive food intake disorder: Secondary | ICD-10-CM | POA: Diagnosis not present

## 2022-02-03 DIAGNOSIS — F5082 Avoidant/restrictive food intake disorder: Secondary | ICD-10-CM | POA: Diagnosis not present

## 2022-02-06 IMAGING — RF DG HIP (WITH PELVIS) OPERATIVE*R*
1 series · 4 of 4 positions shown · non-contrast
Comparison: None.

FLUOROSCOPY TIME:  0 minutes 11 seconds; 4 acquired images

CLINICAL DATA: Status post right total hip replacement

EXAM:
OPERATIVE RIGHT HIP (WITH PELVIS IF PERFORMED) 2 VIEWS
TECHNIQUE: Fluoroscopic spot image(s) were submitted for interpretation
post-operatively.

[Series 1: unknown protocol · 0.20mm/px · 4 of 4 slices shown]
[im 1/4]
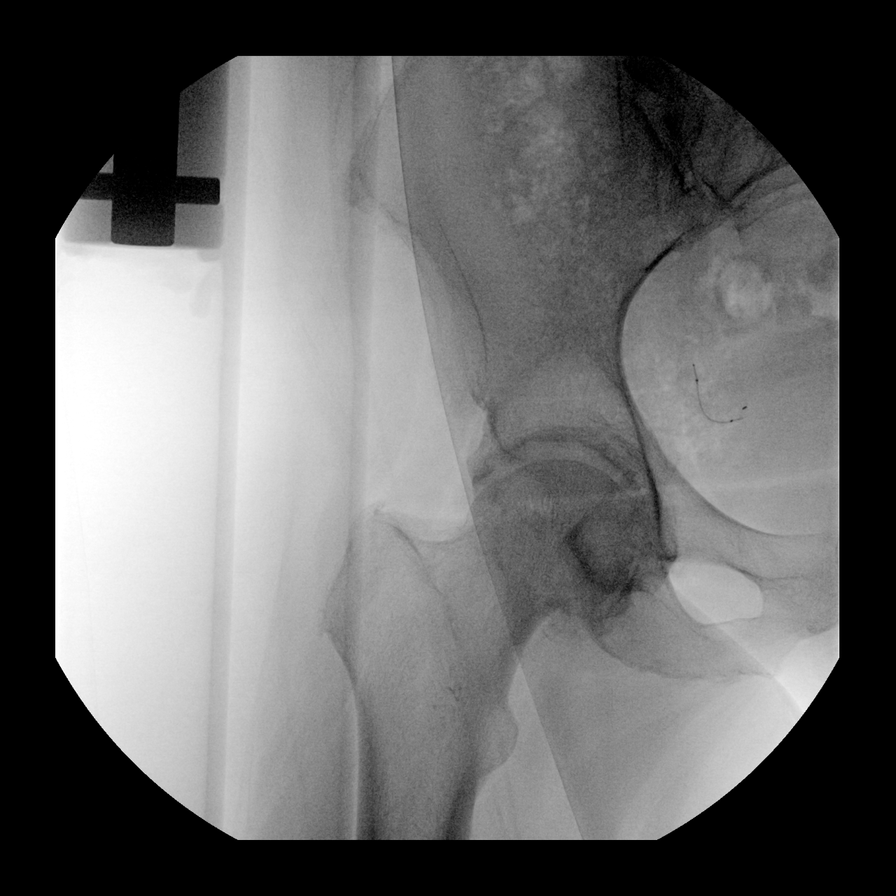
[im 2/4]
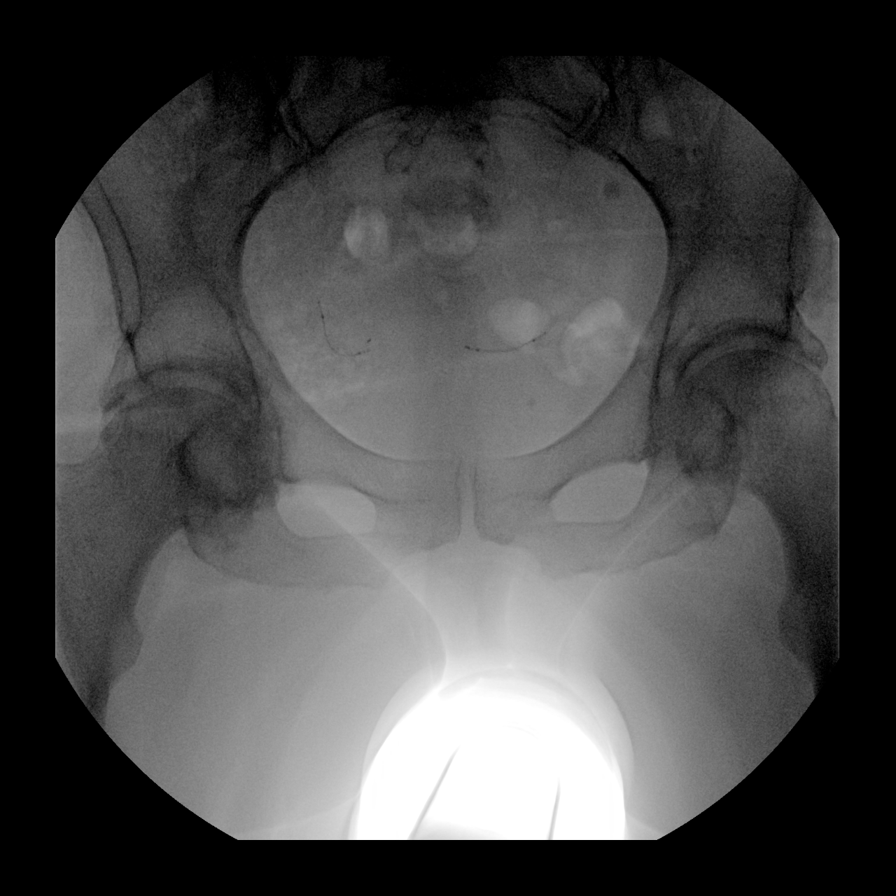
[im 3/4]
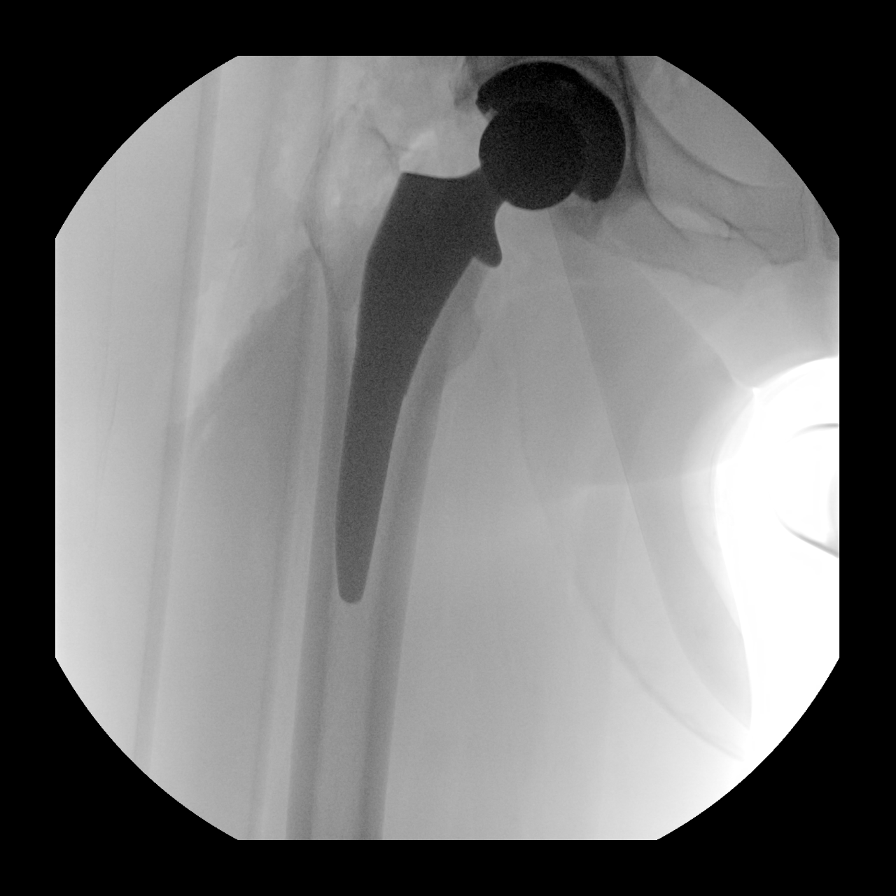
[im 4/4]
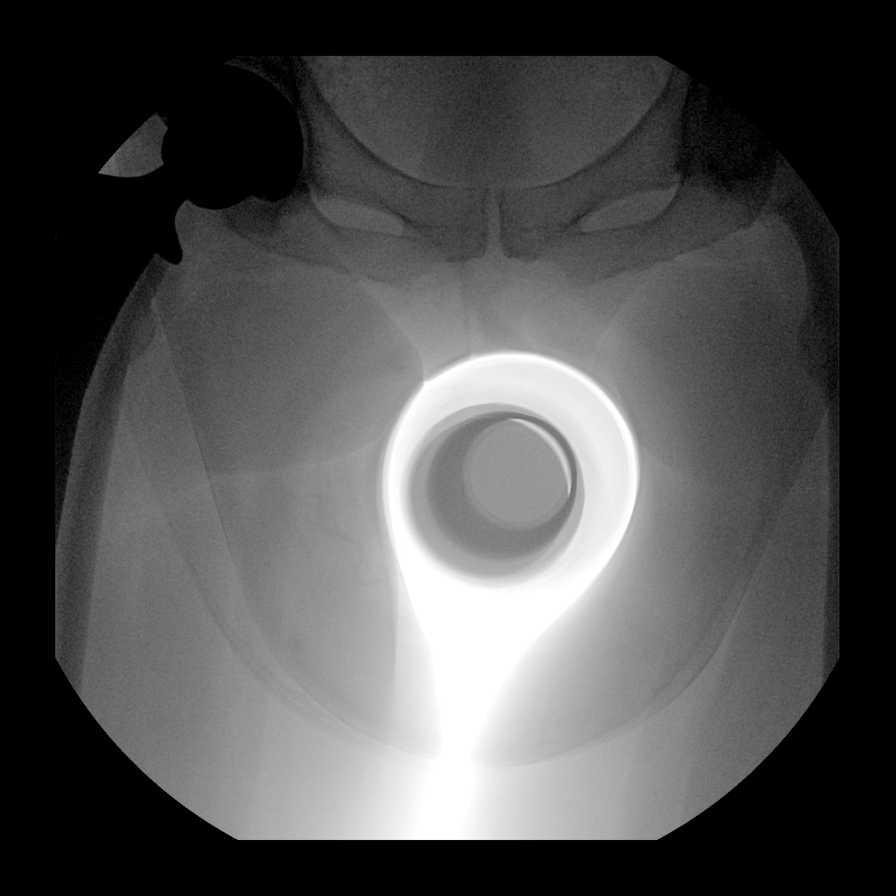

[4 of 4 positions shown; findings below may reference images not displayed]

FINDINGS: Initial frontal view shows moderate osteoarthritic change in the
right hip joint. No fracture or dislocation. Apparent Essure type
devices noted in the left and right pelvic regions. There is
narrowing of the left hip joint as well.

Subsequently, there is frontal imaging of a total hip replacement
right with prosthetic components well-seated on frontal view. No
fracture or dislocation evident.
IMPRESSION: Status post total hip replacement on the right with prosthetic
components well-seated on frontal view. No fracture or dislocation.
Essure type devices noted in each fallopian tube region.

## 2022-02-06 IMAGING — RF DG C-ARM 1-60 MIN-NO REPORT
1 series · 4 of 4 positions shown · non-contrast
Comparison: None.

FLUOROSCOPY TIME:  0 minutes 11 seconds; 4 acquired images

CLINICAL DATA: Status post right total hip replacement

EXAM:
OPERATIVE RIGHT HIP (WITH PELVIS IF PERFORMED) 2 VIEWS
TECHNIQUE: Fluoroscopic spot image(s) were submitted for interpretation
post-operatively.

[Series 1: unknown protocol · 0.20mm/px · 4 of 4 slices shown]
[im 1/4]
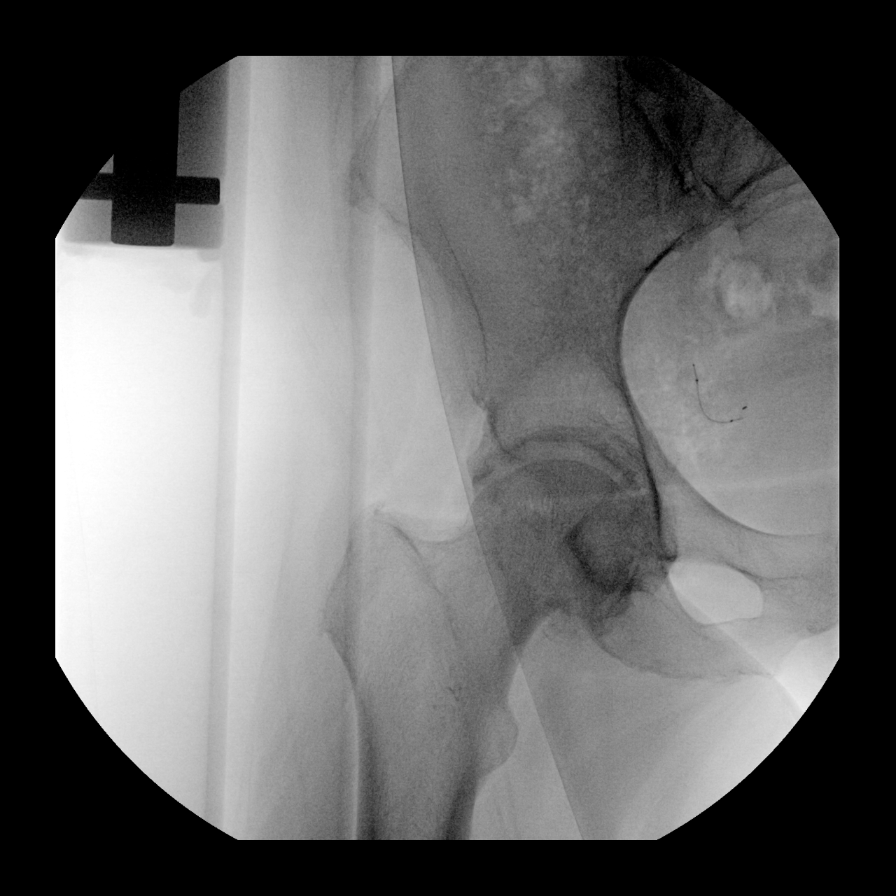
[im 2/4]
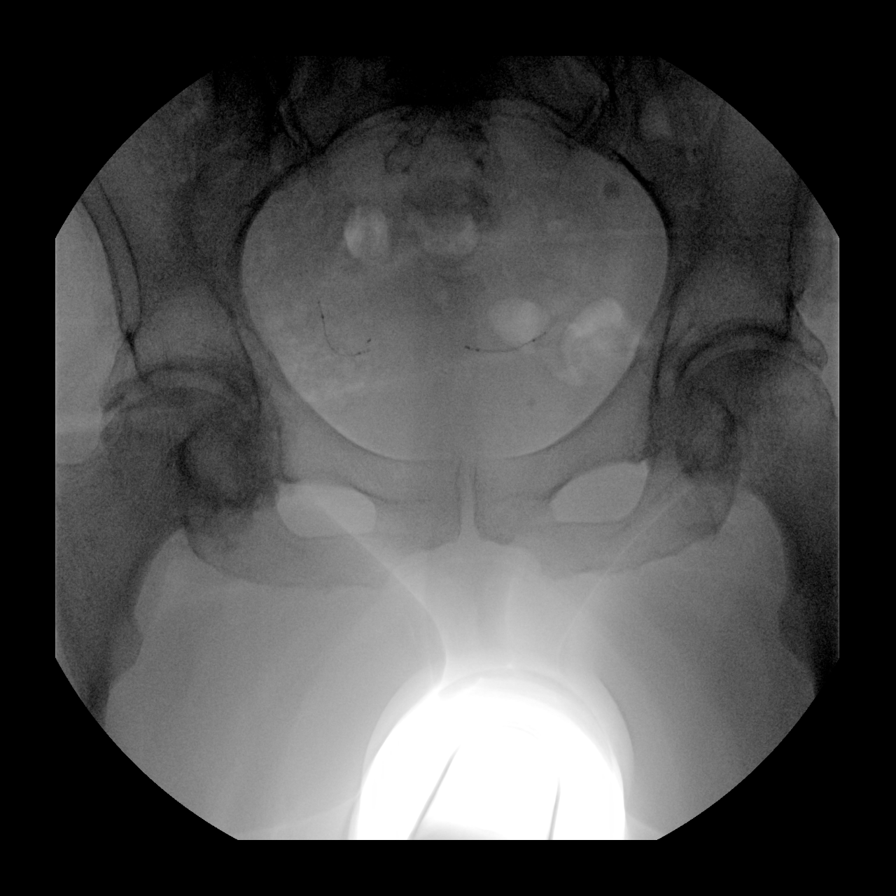
[im 3/4]
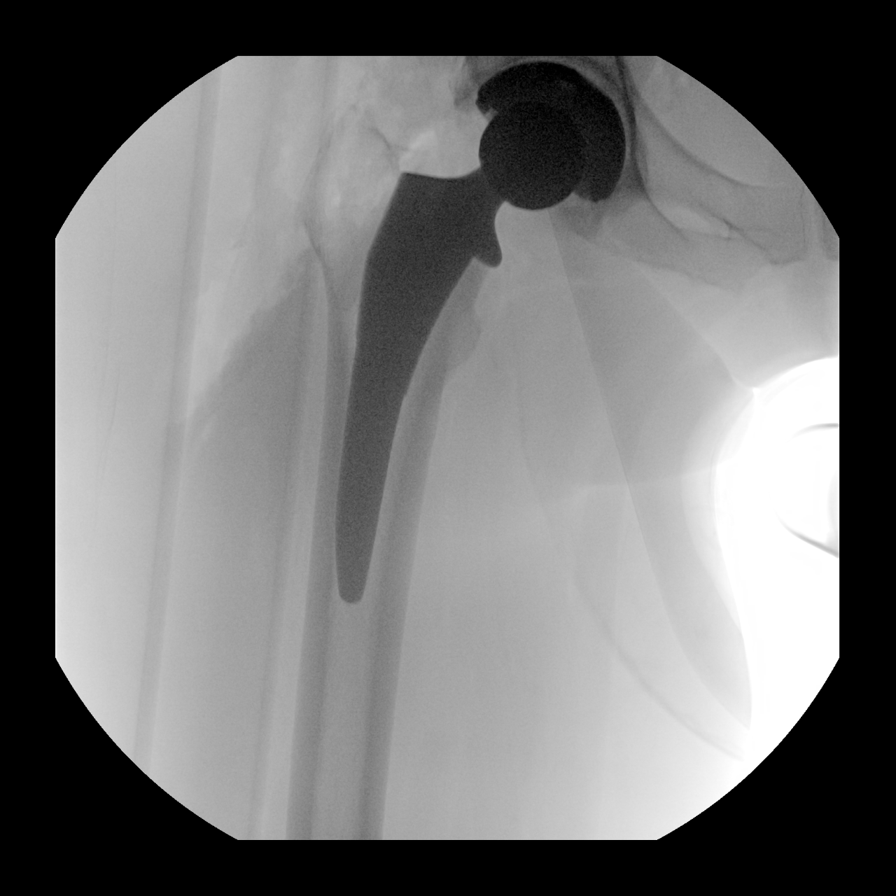
[im 4/4]
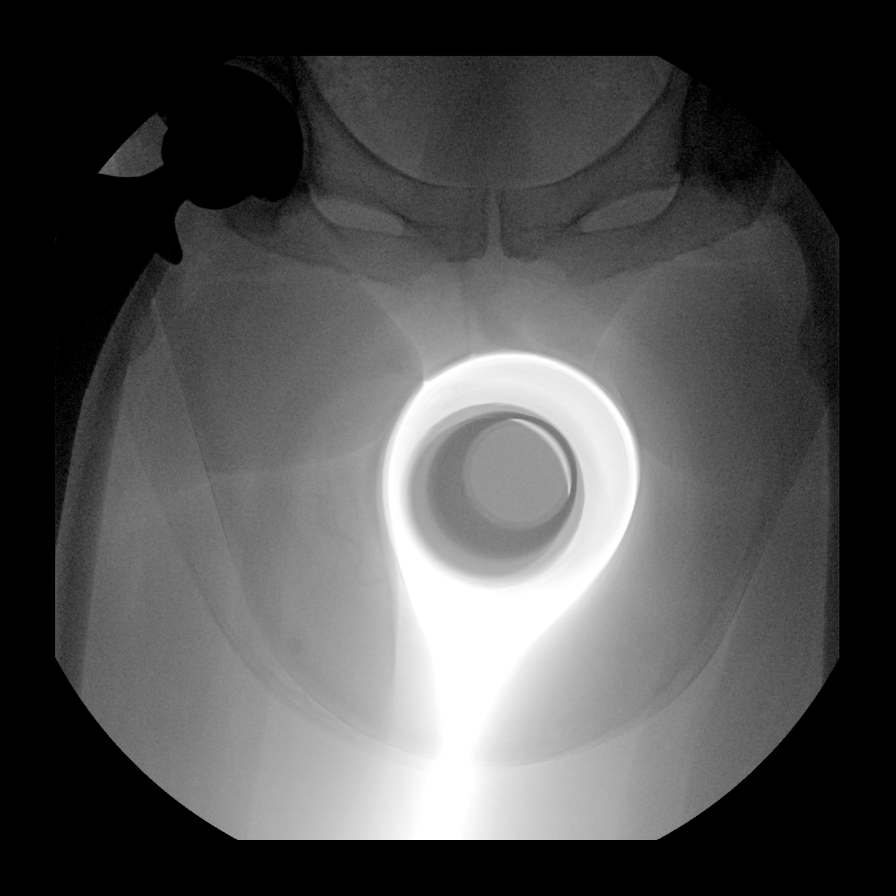

[4 of 4 positions shown; findings below may reference images not displayed]

FINDINGS: Initial frontal view shows moderate osteoarthritic change in the
right hip joint. No fracture or dislocation. Apparent Essure type
devices noted in the left and right pelvic regions. There is
narrowing of the left hip joint as well.

Subsequently, there is frontal imaging of a total hip replacement
right with prosthetic components well-seated on frontal view. No
fracture or dislocation evident.
IMPRESSION: Status post total hip replacement on the right with prosthetic
components well-seated on frontal view. No fracture or dislocation.
Essure type devices noted in each fallopian tube region.

## 2022-02-06 IMAGING — DX DG PORTABLE PELVIS
1 series · 1 of 1 positions shown · non-contrast
Comparison: None.

CLINICAL DATA: Right total hip arthroplasty

EXAM:
PORTABLE PELVIS 1-2 VIEWS

[pelvis ap]
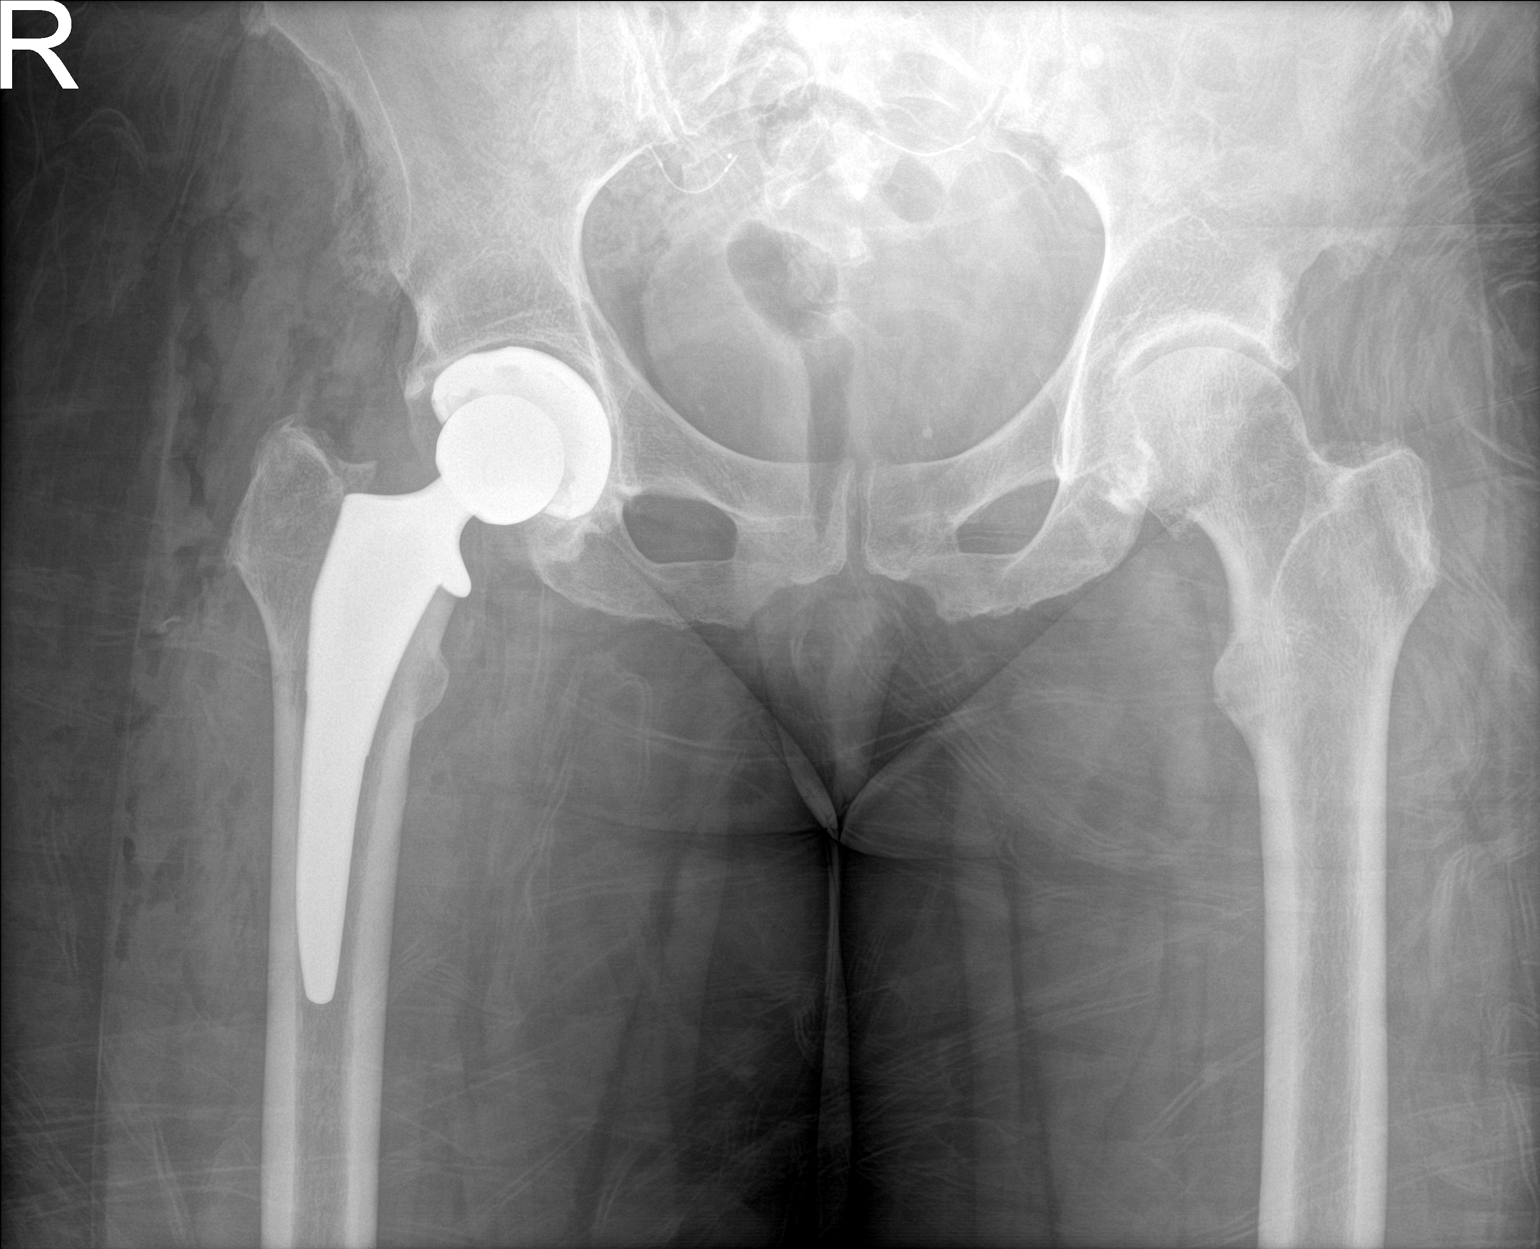

[1 of 1 positions shown; findings below may reference images not displayed]

FINDINGS: Status post right total hip arthroplasty with well-positioned right
acetabular and right proximal femoral prostheses. No evidence of hip
dislocation on this single frontal view. No acute osseous fracture.
No focal osseous lesions. Expected soft tissue gas surrounding the
right hip joint. Essure tubal microinserts noted bilaterally in the
deep pelvis.
IMPRESSION: Satisfactory immediate postoperative frontal view appearance status
post right total hip arthroplasty.

## 2022-02-10 DIAGNOSIS — F5082 Avoidant/restrictive food intake disorder: Secondary | ICD-10-CM | POA: Diagnosis not present

## 2022-02-26 DIAGNOSIS — Z1231 Encounter for screening mammogram for malignant neoplasm of breast: Secondary | ICD-10-CM | POA: Diagnosis not present

## 2022-03-24 DIAGNOSIS — E890 Postprocedural hypothyroidism: Secondary | ICD-10-CM | POA: Diagnosis not present

## 2022-03-28 DIAGNOSIS — Z9884 Bariatric surgery status: Secondary | ICD-10-CM | POA: Diagnosis not present

## 2022-03-28 DIAGNOSIS — H0014 Chalazion left upper eyelid: Secondary | ICD-10-CM | POA: Diagnosis not present

## 2022-03-28 DIAGNOSIS — K219 Gastro-esophageal reflux disease without esophagitis: Secondary | ICD-10-CM | POA: Diagnosis not present

## 2022-03-28 DIAGNOSIS — N898 Other specified noninflammatory disorders of vagina: Secondary | ICD-10-CM | POA: Diagnosis not present

## 2022-04-06 DIAGNOSIS — H0014 Chalazion left upper eyelid: Secondary | ICD-10-CM | POA: Diagnosis not present

## 2022-05-20 DIAGNOSIS — E890 Postprocedural hypothyroidism: Secondary | ICD-10-CM | POA: Diagnosis not present

## 2022-06-14 ENCOUNTER — Other Ambulatory Visit (HOSPITAL_COMMUNITY)
Admission: RE | Admit: 2022-06-14 | Discharge: 2022-06-14 | Disposition: A | Discharge status: Home / Self Care | Payer: Federal, State, Local not specified - PPO | Source: Ambulatory Visit | Attending: Family Medicine | Admitting: Family Medicine

## 2022-06-14 ENCOUNTER — Other Ambulatory Visit: Payer: Self-pay | Admitting: Family Medicine

## 2022-06-14 DIAGNOSIS — E282 Polycystic ovarian syndrome: Secondary | ICD-10-CM | POA: Diagnosis not present

## 2022-06-14 DIAGNOSIS — L680 Hirsutism: Secondary | ICD-10-CM | POA: Diagnosis not present

## 2022-06-14 DIAGNOSIS — Z01411 Encounter for gynecological examination (general) (routine) with abnormal findings: Secondary | ICD-10-CM | POA: Insufficient documentation

## 2022-06-14 DIAGNOSIS — N898 Other specified noninflammatory disorders of vagina: Secondary | ICD-10-CM | POA: Diagnosis not present

## 2022-06-14 DIAGNOSIS — E7800 Pure hypercholesterolemia, unspecified: Secondary | ICD-10-CM | POA: Diagnosis not present

## 2022-06-14 DIAGNOSIS — Z0000 Encounter for general adult medical examination without abnormal findings: Secondary | ICD-10-CM | POA: Diagnosis not present

## 2022-06-14 DIAGNOSIS — Z9884 Bariatric surgery status: Secondary | ICD-10-CM | POA: Diagnosis not present

## 2022-06-15 LAB — CYTOLOGY - PAP
Comment: NEGATIVE
Diagnosis: NEGATIVE
High risk HPV: NEGATIVE

## 2022-07-29 DIAGNOSIS — R7303 Prediabetes: Secondary | ICD-10-CM | POA: Diagnosis not present

## 2022-07-29 DIAGNOSIS — Z9884 Bariatric surgery status: Secondary | ICD-10-CM | POA: Diagnosis not present

## 2022-07-29 DIAGNOSIS — Z86010 Personal history of colonic polyps: Secondary | ICD-10-CM | POA: Diagnosis not present

## 2022-08-19 ENCOUNTER — Encounter (HOSPITAL_COMMUNITY): Payer: Self-pay | Admitting: *Deleted

## 2022-10-14 DIAGNOSIS — Z09 Encounter for follow-up examination after completed treatment for conditions other than malignant neoplasm: Secondary | ICD-10-CM | POA: Diagnosis not present

## 2022-10-14 DIAGNOSIS — K648 Other hemorrhoids: Secondary | ICD-10-CM | POA: Diagnosis not present

## 2022-10-14 DIAGNOSIS — Z86010 Personal history of colonic polyps: Secondary | ICD-10-CM | POA: Diagnosis not present

## 2022-12-08 DIAGNOSIS — G43709 Chronic migraine without aura, not intractable, without status migrainosus: Secondary | ICD-10-CM | POA: Diagnosis not present

## 2022-12-08 DIAGNOSIS — R682 Dry mouth, unspecified: Secondary | ICD-10-CM | POA: Diagnosis not present

## 2022-12-20 DIAGNOSIS — E890 Postprocedural hypothyroidism: Secondary | ICD-10-CM | POA: Diagnosis not present

## 2022-12-20 DIAGNOSIS — E282 Polycystic ovarian syndrome: Secondary | ICD-10-CM | POA: Diagnosis not present

## 2022-12-20 DIAGNOSIS — R7303 Prediabetes: Secondary | ICD-10-CM | POA: Diagnosis not present

## 2022-12-20 DIAGNOSIS — Z85850 Personal history of malignant neoplasm of thyroid: Secondary | ICD-10-CM | POA: Diagnosis not present

## 2022-12-22 DIAGNOSIS — H04129 Dry eye syndrome of unspecified lacrimal gland: Secondary | ICD-10-CM | POA: Diagnosis not present

## 2022-12-22 DIAGNOSIS — R7301 Impaired fasting glucose: Secondary | ICD-10-CM | POA: Diagnosis not present

## 2022-12-22 DIAGNOSIS — R682 Dry mouth, unspecified: Secondary | ICD-10-CM | POA: Diagnosis not present

## 2022-12-22 DIAGNOSIS — R7303 Prediabetes: Secondary | ICD-10-CM | POA: Diagnosis not present

## 2022-12-22 DIAGNOSIS — N898 Other specified noninflammatory disorders of vagina: Secondary | ICD-10-CM | POA: Diagnosis not present

## 2022-12-22 DIAGNOSIS — Z85850 Personal history of malignant neoplasm of thyroid: Secondary | ICD-10-CM | POA: Diagnosis not present

## 2023-01-12 DIAGNOSIS — Z9884 Bariatric surgery status: Secondary | ICD-10-CM | POA: Diagnosis not present

## 2023-01-12 DIAGNOSIS — F419 Anxiety disorder, unspecified: Secondary | ICD-10-CM | POA: Diagnosis not present

## 2023-01-12 DIAGNOSIS — G44209 Tension-type headache, unspecified, not intractable: Secondary | ICD-10-CM | POA: Diagnosis not present

## 2023-01-21 ENCOUNTER — Other Ambulatory Visit: Payer: Self-pay

## 2023-01-21 ENCOUNTER — Emergency Department (HOSPITAL_COMMUNITY): Payer: Federal, State, Local not specified - PPO

## 2023-01-21 ENCOUNTER — Inpatient Hospital Stay (HOSPITAL_COMMUNITY)
Admission: EM | Admit: 2023-01-21 | Discharge: 2023-01-22 | DRG: 390 | Disposition: A | Discharge status: Home / Self Care | Payer: Federal, State, Local not specified - PPO | Attending: Surgery | Admitting: Surgery

## 2023-01-21 ENCOUNTER — Encounter (HOSPITAL_COMMUNITY): Payer: Self-pay

## 2023-01-21 DIAGNOSIS — Z91040 Latex allergy status: Secondary | ICD-10-CM | POA: Diagnosis not present

## 2023-01-21 DIAGNOSIS — K56600 Partial intestinal obstruction, unspecified as to cause: Secondary | ICD-10-CM | POA: Diagnosis not present

## 2023-01-21 DIAGNOSIS — Z96641 Presence of right artificial hip joint: Secondary | ICD-10-CM | POA: Diagnosis not present

## 2023-01-21 DIAGNOSIS — R1012 Left upper quadrant pain: Secondary | ICD-10-CM | POA: Diagnosis not present

## 2023-01-21 DIAGNOSIS — R7303 Prediabetes: Secondary | ICD-10-CM | POA: Diagnosis present

## 2023-01-21 DIAGNOSIS — Z885 Allergy status to narcotic agent status: Secondary | ICD-10-CM | POA: Diagnosis not present

## 2023-01-21 DIAGNOSIS — E860 Dehydration: Secondary | ICD-10-CM | POA: Diagnosis not present

## 2023-01-21 DIAGNOSIS — Z85850 Personal history of malignant neoplasm of thyroid: Secondary | ICD-10-CM

## 2023-01-21 DIAGNOSIS — Z79890 Hormone replacement therapy (postmenopausal): Secondary | ICD-10-CM

## 2023-01-21 DIAGNOSIS — Z888 Allergy status to other drugs, medicaments and biological substances status: Secondary | ICD-10-CM | POA: Diagnosis not present

## 2023-01-21 DIAGNOSIS — Z79899 Other long term (current) drug therapy: Secondary | ICD-10-CM | POA: Diagnosis not present

## 2023-01-21 DIAGNOSIS — K219 Gastro-esophageal reflux disease without esophagitis: Secondary | ICD-10-CM | POA: Diagnosis not present

## 2023-01-21 DIAGNOSIS — Z880 Allergy status to penicillin: Secondary | ICD-10-CM | POA: Diagnosis not present

## 2023-01-21 DIAGNOSIS — Z91018 Allergy to other foods: Secondary | ICD-10-CM

## 2023-01-21 DIAGNOSIS — F419 Anxiety disorder, unspecified: Secondary | ICD-10-CM | POA: Diagnosis present

## 2023-01-21 DIAGNOSIS — G4730 Sleep apnea, unspecified: Secondary | ICD-10-CM | POA: Diagnosis present

## 2023-01-21 DIAGNOSIS — R0602 Shortness of breath: Secondary | ICD-10-CM | POA: Diagnosis not present

## 2023-01-21 DIAGNOSIS — K5669 Other partial intestinal obstruction: Secondary | ICD-10-CM | POA: Diagnosis not present

## 2023-01-21 DIAGNOSIS — R109 Unspecified abdominal pain: Secondary | ICD-10-CM | POA: Diagnosis not present

## 2023-01-21 DIAGNOSIS — Z9884 Bariatric surgery status: Secondary | ICD-10-CM | POA: Diagnosis not present

## 2023-01-21 DIAGNOSIS — K56609 Unspecified intestinal obstruction, unspecified as to partial versus complete obstruction: Secondary | ICD-10-CM | POA: Diagnosis not present

## 2023-01-21 DIAGNOSIS — E039 Hypothyroidism, unspecified: Secondary | ICD-10-CM | POA: Diagnosis not present

## 2023-01-21 LAB — CBC WITH DIFFERENTIAL/PLATELET
Abs Immature Granulocytes: 0.03 10*3/uL (ref 0.00–0.07)
Basophils Absolute: 0.1 10*3/uL (ref 0.0–0.1)
Basophils Relative: 1 %
Eosinophils Absolute: 0.1 10*3/uL (ref 0.0–0.5)
Eosinophils Relative: 1 %
HCT: 42.3 % (ref 36.0–46.0)
Hemoglobin: 14 g/dL (ref 12.0–15.0)
Immature Granulocytes: 0 %
Lymphocytes Relative: 15 %
Lymphs Abs: 1.7 10*3/uL (ref 0.7–4.0)
MCH: 31.7 pg (ref 26.0–34.0)
MCHC: 33.1 g/dL (ref 30.0–36.0)
MCV: 95.7 fL (ref 80.0–100.0)
Monocytes Absolute: 0.5 10*3/uL (ref 0.1–1.0)
Monocytes Relative: 4 %
Neutro Abs: 8.7 10*3/uL — ABNORMAL HIGH (ref 1.7–7.7)
Neutrophils Relative %: 79 %
Platelets: 200 10*3/uL (ref 150–400)
RBC: 4.42 MIL/uL (ref 3.87–5.11)
RDW: 12.7 % (ref 11.5–15.5)
WBC: 11 10*3/uL — ABNORMAL HIGH (ref 4.0–10.5)
nRBC: 0 % (ref 0.0–0.2)

## 2023-01-21 LAB — URINALYSIS, ROUTINE W REFLEX MICROSCOPIC
Bacteria, UA: NONE SEEN
Bilirubin Urine: NEGATIVE
Glucose, UA: NEGATIVE mg/dL
Hgb urine dipstick: NEGATIVE
Ketones, ur: 5 mg/dL — AB
Nitrite: NEGATIVE
Protein, ur: NEGATIVE mg/dL
Specific Gravity, Urine: 1.014 (ref 1.005–1.030)
pH: 8 (ref 5.0–8.0)

## 2023-01-21 LAB — COMPREHENSIVE METABOLIC PANEL
ALT: 11 U/L (ref 0–44)
AST: 29 U/L (ref 15–41)
Albumin: 4.4 g/dL (ref 3.5–5.0)
Alkaline Phosphatase: 89 U/L (ref 38–126)
Anion gap: 11 (ref 5–15)
BUN: 12 mg/dL (ref 6–20)
CO2: 28 mmol/L (ref 22–32)
Calcium: 9.4 mg/dL (ref 8.9–10.3)
Chloride: 98 mmol/L (ref 98–111)
Creatinine, Ser: 0.75 mg/dL (ref 0.44–1.00)
GFR, Estimated: >60 mL/min (ref >60)
Glucose, Bld: 139 mg/dL — ABNORMAL HIGH (ref 70–99)
Potassium: 3.7 mmol/L (ref 3.5–5.1)
Sodium: 137 mmol/L (ref 135–145)
Total Bilirubin: 0.6 mg/dL (ref 0.3–1.2)
Total Protein: 7.7 g/dL (ref 6.5–8.1)

## 2023-01-21 LAB — LIPASE, BLOOD: Lipase: 39 U/L (ref 11–51)

## 2023-01-21 MED ORDER — MORPHINE SULFATE (PF) 4 MG/ML IV SOLN
4.0000 mg | Freq: Once | INTRAVENOUS | Status: AC
  Administered 2023-01-21: 4 mg via INTRAVENOUS
  Filled 2023-01-21: qty 1

## 2023-01-21 MED ORDER — HYDROMORPHONE HCL 1 MG/ML IJ SOLN
1.0000 mg | INTRAMUSCULAR | Status: DC | PRN
  Administered 2023-01-21: 1 mg via INTRAVENOUS
  Filled 2023-01-21: qty 1

## 2023-01-21 MED ORDER — ONDANSETRON HCL 4 MG/2ML IJ SOLN: 4.0000 mg | Freq: Four times a day (QID) | INTRAMUSCULAR | Status: DC | PRN

## 2023-01-21 MED ORDER — ENOXAPARIN SODIUM 40 MG/0.4ML IJ SOSY
40.0000 mg | PREFILLED_SYRINGE | INTRAMUSCULAR | Status: DC
  Administered 2023-01-21: 40 mg via SUBCUTANEOUS
  Filled 2023-01-21: qty 0.4

## 2023-01-21 MED ORDER — SODIUM CHLORIDE 0.9 % IV BOLUS
500.0000 mL | Freq: Once | INTRAVENOUS | Status: AC
  Administered 2023-01-21: 500 mL via INTRAVENOUS

## 2023-01-21 MED ORDER — SODIUM CHLORIDE 0.9 % IV SOLN: INTRAVENOUS | Status: DC

## 2023-01-21 MED ORDER — LORAZEPAM 2 MG/ML IJ SOLN
0.5000 mg | Freq: Once | INTRAMUSCULAR | Status: AC
  Administered 2023-01-21: 0.5 mg via INTRAVENOUS
  Filled 2023-01-21: qty 1

## 2023-01-21 MED ORDER — ONDANSETRON 4 MG PO TBDP: 4.0000 mg | ORAL_TABLET | Freq: Four times a day (QID) | ORAL | Status: DC | PRN

## 2023-01-21 MED ORDER — IOHEXOL 300 MG/ML  SOLN
100.0000 mL | Freq: Once | INTRAMUSCULAR | Status: AC | PRN
  Administered 2023-01-21: 100 mL via INTRAVENOUS

## 2023-01-21 NOTE — ED Triage Notes (Signed)
Patient has centralized abdominal pain that moves around to the left side. Causing her shortness of breath and she said she feels like she is having a baby.

## 2023-01-21 NOTE — ED Provider Notes (Signed)
[facility] Provider Note   CSN: [id number] Arrival date & time: 01/21/23  [id number]     History  Chief Complaint  Patient presents with   Shortness of Breath    Erika Lambert is a 57 y.o. female.  57 year old female with prior medical history as detailed below presents for evaluation.  Patient complains of left-sided mid abdominal discomfort.  Pain began approximately 4-5 hours prior to arrival.  Patient reports that she was having a meal.  Patient reports that pain initially began in the mid abdomen and then has moved to the left upper quadrant.  She is status post prior gastric bypass.  She thinks that her pain may be related to a "gas bubble" -she has had similar symptoms in the past but never this bad.  She denies nausea or vomiting.  She denies fever.  She reports that she was feeling well until roughly 4 to 5 hours prior to evaluation.    The history is provided by the patient and medical records.       Home Medications Prior to Admission medications   Medication Sig Start Date End Date Taking? Authorizing Provider  acetaminophen (TYLENOL) 500 MG tablet Take 500 mg by mouth every 6 (six) hours as needed (pain).    [provider]  diphenhydrAMINE (BENADRYL) 25 mg capsule Take 25 mg by mouth at bedtime as needed for allergies.    [provider]  levocetirizine (XYZAL) 5 MG tablet Take 5 mg by mouth at bedtime as needed for allergies.    [provider]  levothyroxine (SYNTHROID) 112 MCG tablet Take 1 tablet by mouth. Barron Schmid - Friday 04/30/21   [provider]  levothyroxine (SYNTHROID) 125 MCG tablet Take 125 mcg by mouth. Takes Sat and Sunday 02/17/20   [provider]  Multiple Vitamin (MULTIVITAMIN WITH MINERALS) TABS tablet Take 1 tablet by mouth daily at 6 PM. Centrum Silver for Women    [provider]  omeprazole (PRILOSEC) 20 MG capsule Take 1 capsule (20 mg total)  by mouth 2 (two) times daily. 01/28/21   Kinsinger, [name], MD      Allergies    Grass pollen([id number] vern), Other, Tomato, Latex, Pollen extract, Red dye, Whey, Codeine, Dust mite extract, Nutritional supplements, Albuterol, and Penicillins    Review of Systems   Review of Systems  All other systems reviewed and are negative.   Physical Exam Updated Vital Signs BP (!) 120/91 (BP Location: Left Arm)   Pulse 73   Temp 98.4 F (36.9 C) (Oral)   Resp 19   Ht '5\' 5"'$  (1.651 m)   Wt 82 kg   SpO2 100%   BMI 30.08 kg/m  Physical Exam Vitals and nursing note reviewed.  Constitutional:      General: She is not in acute distress.    Appearance: Normal appearance. She is well-developed.  HENT:     Head: Normocephalic and atraumatic.  Eyes:     Conjunctiva/sclera: Conjunctivae normal.     Pupils: Pupils are equal, round, and reactive to light.  Cardiovascular:     Rate and Rhythm: Normal rate and regular rhythm.     Heart sounds: Normal heart sounds.  Pulmonary:     Effort: Pulmonary effort is normal. No respiratory distress.     Breath sounds: Normal breath sounds.  Abdominal:     General: There is no distension.     Palpations: Abdomen is soft.  Tenderness: There is abdominal tenderness.     Comments: Mild tenderness palpation to the left upper quadrant  Musculoskeletal:        General: No deformity. Normal range of motion.     Cervical back: Normal range of motion and neck supple.  Skin:    General: Skin is warm and dry.  Neurological:     General: No focal deficit present.     Mental Status: She is alert and oriented to person, place, and time.     ED Results / Procedures / Treatments   Labs (all labs ordered are listed, but only abnormal results are displayed) Labs Reviewed  CBC WITH DIFFERENTIAL/PLATELET - Abnormal; Notable for the following components:      Result Value   WBC 11.0 (*)    Neutro Abs 8.7 (*)    All other components within normal limits   COMPREHENSIVE METABOLIC PANEL - Abnormal; Notable for the following components:   Glucose, Bld 139 (*)    All other components within normal limits  URINALYSIS, ROUTINE W REFLEX MICROSCOPIC - Abnormal; Notable for the following components:   Ketones, ur 5 (*)    Leukocytes,Ua SMALL (*)    All other components within normal limits  LIPASE, BLOOD    EKG EKG Interpretation  Date/Time:  Saturday January 21 2023 19:21:25 EST Ventricular Rate:  70 PR Interval:  156 QRS Duration: 103 QT Interval:  398 QTC Calculation: 430 R Axis:   18 Text Interpretation: Sinus rhythm Consider left atrial enlargement Low voltage, precordial leads Abnormal R-wave progression, early transition Confirmed by Dene Gentry [phone]) on 01/21/2023 7:56:05 PM  Radiology DG Chest Port 1 View  Result Date: 01/21/2023 CLINICAL DATA:  Shortness of breath EXAM: PORTABLE CHEST 1 VIEW COMPARISON:  None Available. FINDINGS: The heart size and mediastinal contours are within normal limits. Both lungs are clear. The visualized skeletal structures are unremarkable. IMPRESSION: No active disease. Electronically Signed   By: Placido Sou M.D.   On: 01/21/2023 20:25    Procedures Procedures    Medications Ordered in ED Medications  LORazepam (ATIVAN) injection 0.5 mg (0.5 mg Intravenous Given 01/21/23 2006)  morphine (PF) 4 MG/ML injection 4 mg (4 mg Intravenous Given 01/21/23 2006)  sodium chloride 0.9 % bolus 500 mL (500 mLs Intravenous New Bag/Given 01/21/23 2006)    ED Course/ Medical Decision Making/ A&P                             Medical Decision Making Amount and/or Complexity of Data Reviewed Labs: ordered. Radiology: ordered.  Risk Prescription drug management. Decision regarding hospitalization.    Medical Screen Complete  This patient presented to the ED with complaint of LUQ abdominal pain.  This complaint involves an extensive number of treatment options. The initial differential diagnosis  includes, but is not limited to, metabolic abnormality, intra-abdominal pathology such as obstruction, bowel perforation, etc  This presentation is: Acute, Self-Limited, Previously Undiagnosed, Uncertain Prognosis, Complicated, Systemic Symptoms, and Threat to Life/Bodily Function  Patient with history of gastric bypass (followed by Dr. Cherlyn Roberts).  Patient with left upper quadrant abdominal pain began earlier this afternoon.  Patient is significantly improved with IV fluids, Ativan, morphine.  CT imaging is concerning for possible early partial small bowel or small bowel obstruction.  Dr. Ninfa Linden, with surgery, will evaluate in ED.     Additional history obtained:  External records from outside sources obtained and reviewed including prior ED visits  and prior Inpatient records.    Lab Tests:  I ordered and personally interpreted labs.  The pertinent results include: CBC, CMP, lipase, UA   Imaging Studies ordered:  I ordered imaging studies including ET abdomen pelvis I independently visualized and interpreted obtained imaging which showed partial or early small bowel obstruction I agree with the radiologist interpretation.   Cardiac Monitoring:  The patient was maintained on a cardiac monitor.  I personally viewed and interpreted the cardiac monitor which showed an underlying rhythm of: NSR   Medicines ordered:  I ordered medication including Ativan, morphine, IV fluids for abdominal pain Reevaluation of the patient after these medicines showed that the patient: improved    Problem List / ED Course:  Abdominal pain, suspected partial small bowel obstruction   Reevaluation:  After the interventions noted above, I reevaluated the patient and found that they have: improved  Disposition:  After consideration of the diagnostic results and the patients response to treatment, I feel that the patent would benefit from surgical evaluation and likely admission.           Final Clinical Impression(s) / ED Diagnoses Final diagnoses:  Left upper quadrant abdominal pain    Rx / DC Orders ED Discharge Orders     None         Valarie Merino, MD 01/21/23 2224

## 2023-01-21 NOTE — H&P (Signed)
Erika Lambert is an 57 y.o. female.   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 Cherlyn Roberts.  She had issues with dehydration several months after surgery but since been doing well.  Dr. Cherlyn Roberts saw her last in the office on February 15 of this year.  Earlier today, she was eating in a restaurant when she started developing left-sided abdominal pain.  She describes it as a sharp and severe cramping began in the mid abdomen and moved to the left upper quadrant.  She denies any nausea or vomiting.  The pain lasted for about 4 to 5 hours so she presented to the emergency department.  She is now sitting up and having much less discomfort.  She is otherwise without complaints.  Past Medical History:  Diagnosis Date   Anxiety    Arthritis    Asthma 2016   environmentla asthma    Cancer ([place]) 2009   thyroid   GERD (gastroesophageal reflux disease)    Headache    Hypothyroidism    Obesity    Pneumonia    hx of    Pre-diabetes    Sleep apnea    no longer uses cpap     Past Surgical History:  Procedure Laterality Date   BARIATRIC SURGERY     BREAST SURGERY Bilateral 2005   lift and skin removed   BUNIONECTOMY Left    [place]     for skin iremoval due to weight loss    GASTRIC ROUX-EN-Y N/A 01/26/2021   Procedure: LAPAROSCOPIC ROUX-EN-Y GASTRIC BYPASS WITH UPPER ENDOSCOPY;  Surgeon: Kieth Brightly, [name], MD;  Location[facility];  Service: General;  Laterality: N/A;   TOTAL HIP ARTHROPLASTY Right 04/22/2020   Procedure: TOTAL HIP ARTHROPLASTY ANTERIOR APPROACH [place];  Surgeon: Gaynelle Arabian, MD;  Location: [facility];  Service: Orthopedics;  Laterality: Right;  177mn   TOTAL THYROIDECTOMY  2009   UPPER GI ENDOSCOPY N/A 01/26/2021   Procedure: UPPER GI ENDOSCOPY;  Surgeon: KKieth Brightly LArta Bruce MD;  [facility];   Service: General;  Laterality: N/A;    Family History  Adopted: Yes   Social History:  reports that she has never smoked. She has never used smokeless tobacco. She reports that she does not drink alcohol and does not use drugs.  Allergies:  Allergies  Allergen Reactions   Grass Pollen(K-O-R-T-Swt Vern) Hives   Other Hives   Tomato Shortness Of Breath   Latex Other (See Comments)    welts   Pollen Extract Hives   Red Dye Nausea Only and Other (See Comments)    Unable to tolerate any foods with red dye.  Stomach pains   Whey Other (See Comments)    Severe bloating. Unable to tolerate any foods with whey   Codeine Other (See Comments)   Dust Mite Extract    Nutritional Supplements Other (See Comments)   Albuterol Hives   Penicillins Hives and Other (See Comments)    Dizziness Tolerated Cephalosporin Date: 04/23/20.     (Not in a hospital admission)   Results for orders placed or performed during the hospital encounter of 01/21/23 (from the past 48 hour(s))  Urinalysis, Routine w reflex microscopic -Urine, Clean Catch     Status: Abnormal   Collection Time: 01/21/23  8:08 PM  Result Value Ref Range   Color,  Urine YELLOW YELLOW   APPearance CLEAR CLEAR   Specific Gravity, Urine 1.014 1.005 - 1.030   pH 8.0 5.0 - 8.0   Glucose, UA NEGATIVE NEGATIVE mg/dL   Hgb urine dipstick NEGATIVE NEGATIVE   Bilirubin Urine NEGATIVE NEGATIVE   Ketones, ur 5 (A) NEGATIVE mg/dL   Protein, ur NEGATIVE NEGATIVE mg/dL   Nitrite NEGATIVE NEGATIVE   Leukocytes,Ua SMALL (A) NEGATIVE   RBC / HPF 0-5 0 - 5 RBC/hpf   WBC, UA 6-10 0 - 5 WBC/hpf   Bacteria, UA NONE SEEN NONE SEEN   Squamous Epithelial / HPF 0-5 0 - 5 /HPF    Comment: Performed at [facility], [place] [address], [place], [place] [address]  CBC with Differential     Status: Abnormal   Collection Time: 01/21/23  8:18 PM  Result Value Ref Range   WBC 11.0 (H) 4.0 - 10.5 K/uL   RBC 4.42 3.87 - 5.11 MIL/uL    Hemoglobin 14.0 12.0 - 15.0 g/dL   HCT 42.3 36.0 - 46.0 %   MCV 95.7 80.0 - 100.0 fL   MCH 31.7 26.0 - 34.0 pg   MCHC 33.1 30.0 - 36.0 g/dL   RDW 12.7 11.5 - 15.5 %   Platelets 200 150 - 400 K/uL   nRBC 0.0 0.0 - 0.2 %   Neutrophils Relative % 79 %   Neutro Abs 8.7 (H) 1.7 - 7.7 K/uL   Lymphocytes Relative 15 %   Lymphs Abs 1.7 0.7 - 4.0 K/uL   Monocytes Relative 4 %   Monocytes Absolute 0.5 0.1 - 1.0 K/uL   Eosinophils Relative 1 %   Eosinophils Absolute 0.1 0.0 - 0.5 K/uL   Basophils Relative 1 %   Basophils Absolute 0.1 0.0 - 0.1 K/uL   Immature Granulocytes 0 %   Abs Immature Granulocytes 0.03 0.00 - 0.07 K/uL    Comment: Performed at [facility], [place] [address] [place], [place] [address]  Comprehensive metabolic panel     Status: Abnormal   Collection Time: 01/21/23  8:18 PM  Result Value Ref Range   Sodium 137 135 - 145 mmol/L   Potassium 3.7 3.5 - 5.1 mmol/L   Chloride 98 98 - 111 mmol/L   CO2 28 22 - 32 mmol/L   Glucose, Bld 139 (H) 70 - 99 mg/dL    Comment: Glucose reference range applies only to samples taken after fasting for at least 8 hours.   BUN 12 6 - 20 mg/dL   Creatinine, Ser 0.75 0.44 - 1.00 mg/dL   Calcium 9.4 8.9 - 10.3 mg/dL   Total Protein 7.7 6.5 - 8.1 g/dL   Albumin 4.4 3.5 - 5.0 g/dL   AST 29 15 - 41 U/L   ALT 11 0 - 44 U/L   Alkaline Phosphatase 89 38 - 126 U/L   Total Bilirubin 0.6 0.3 - 1.2 mg/dL   GFR, Estimated >60 >60 mL/min    Comment: (NOTE) Calculated using the CKD-EPI Creatinine Equation (2021)    Anion gap 11 5 - 15    Comment: Performed at [facility], [place] [address], [place], [place] [address]  Lipase, blood     Status: None   Collection Time: 01/21/23  8:18 PM  Result Value Ref Range   Lipase 39 11 - 51 U/L    Comment: Performed at [facility], [place] [address] [place], [place] [address]   CT ABDOMEN PELVIS W CONTRAST  Result Date: 01/21/2023 CLINICAL DATA:   57 year old female with abdominal and pelvic pain. History of gastric bypass/Roux-en-Y. EXAM: CT ABDOMEN AND PELVIS WITH CONTRAST TECHNIQUE: Multidetector CT imaging of the abdomen and pelvis was performed using the standard protocol following bolus administration of intravenous contrast. RADIATION DOSE REDUCTION: This exam was performed according to the departmental dose-optimization program which includes automated exposure control, adjustment of the mA and/or kV according to patient size and/or use of iterative reconstruction technique. CONTRAST:  134m OMNIPAQUE IOHEXOL 300 MG/ML  SOLN COMPARISON:  None Available. FINDINGS: Lower chest: No acute abnormality. Hepatobiliary: The liver and gallbladder are unremarkable. There is no evidence of intrahepatic or extrahepatic biliary dilatation. Pancreas: Unremarkable Spleen: Unremarkable Adrenals/Urinary Tract: The kidneys, adrenal glands and bladder are unremarkable. Stomach/Bowel: Gastric bypass class/Roux-en-Y changes noted. The bypassed stomach, afferent loop and what appears to be the efferent loop are unremarkable. A distended loop of small bowel extending from the anterior abdomen into the LEFT anterior pelvis with areas an apparent transition point. No obvious cause identified and this area of transition is located INFERIOR to the enteric anastomotic sutures. No evidence of bowel wall thickening or inflammatory changes noted. Stool and gas within the colon and rectum noted. There is no evidence of pneumoperitoneum. Vascular/Lymphatic: No significant vascular findings are present. No enlarged abdominal or pelvic lymph nodes. Reproductive: Uterus and bilateral adnexa are unremarkable except for evidence of tubal ligation. Other: No ascites or focal collection/abscess. Musculoskeletal: No acute or suspicious bony abnormalities are noted. RIGHT hip arthroplasty changes noted. IMPRESSION: 1. Distended loop of small bowel extending from the anterior abdomen into the  LEFT anterior pelvis to apparent transition point. No obvious cause identified and this area of transition is located INFERIOR to the enteric anastomotic sutures. This is suspicious for a partial or early small bowel obstruction. 2. 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 No evidence of bowel wall thickening or inflammatory changes. Electronically Signed   By: JMargarette CanadaM.D.   On: 01/21/2023 21:30   DG Chest Port 1 View  Result Date: 01/21/2023 CLINICAL DATA:  Shortness of breath EXAM: PORTABLE CHEST 1 VIEW COMPARISON:  None Available. FINDINGS: The heart size and mediastinal contours are within normal limits. Both lungs are clear. The visualized skeletal structures are unremarkable. IMPRESSION: No active disease. Electronically Signed   By: TPlacido SouM.D.   On: 01/21/2023 20:25    Review of Systems  All other systems reviewed and are negative.   Blood pressure (!) 120/91, pulse 73, temperature 98.4 F (36.9 C), temperature source Oral, resp. rate 19, height '5\' 5"'$  (1.651 m), weight 82 kg, SpO2 100 %. Physical Exam Constitutional:      General: She is not in acute distress.    Appearance: She is well-developed. She is not ill-appearing.  HENT:     Head: Normocephalic and atraumatic.  Eyes:     Pupils: Pupils are equal, round, and reactive to light.  Cardiovascular:     Rate and Rhythm: Normal rate and regular rhythm.  Pulmonary:     Effort: Pulmonary effort is normal. No tachypnea.  Abdominal:     Palpations: Abdomen is soft.     Comments: Abdomen is soft and nondistended.  There is some mild tenderness to the left of the umbilicus with minimal guarding  Skin:    General: Skin is warm and dry.  Neurological:     General: No focal deficit present.  Mental Status: She is alert.  Psychiatric:        Mood and Affect: Mood normal.        Behavior: Behavior normal.       Assessment/Plan Partial small bowel obstruction  I reviewed the patient's CAT scan of the abdomen pelvis.  There is a loop of distended small bowel that is distal to her Roux-en-Y bypass.  There is nothing on review of the scan suspicious for a internal hernia.  There is no free air no free fluid.  I discussed the CT findings with the patient and her husband.  I do not see worrisome findings regarding her Roux-en-Y to necessitate surgery tonight.  There is no distended small bowel proximal to this area so also hold on nasogastric tube insertion.  I do believe she needs admission to the hospital for bowel rest and IV fluids.  I will also review the CT scan with one of our bariatric surgeons for an opinion as well.  Should she acutely worsen then we will consider laparoscopy.  She and her husband agree with the plans  Moderately complex medical decision making  Coralie Keens, MD 01/21/2023, 10:51 PM

## 2023-01-22 ENCOUNTER — Inpatient Hospital Stay (HOSPITAL_COMMUNITY): Payer: Federal, State, Local not specified - PPO

## 2023-01-22 DIAGNOSIS — K5669 Other partial intestinal obstruction: Secondary | ICD-10-CM | POA: Diagnosis not present

## 2023-01-22 DIAGNOSIS — Z8719 Personal history of other diseases of the digestive system: Secondary | ICD-10-CM | POA: Diagnosis not present

## 2023-01-22 DIAGNOSIS — N971 Female infertility of tubal origin: Secondary | ICD-10-CM | POA: Diagnosis not present

## 2023-01-22 LAB — CBC
HCT: 37.6 % (ref 36.0–46.0)
Hemoglobin: 12.1 g/dL (ref 12.0–15.0)
MCH: 31.3 pg (ref 26.0–34.0)
MCHC: 32.2 g/dL (ref 30.0–36.0)
MCV: 97.2 fL (ref 80.0–100.0)
Platelets: 171 10*3/uL (ref 150–400)
RBC: 3.87 MIL/uL (ref 3.87–5.11)
RDW: 12.9 % (ref 11.5–15.5)
WBC: 7.2 10*3/uL (ref 4.0–10.5)
nRBC: 0 % (ref 0.0–0.2)

## 2023-01-22 LAB — BASIC METABOLIC PANEL
Anion gap: 6 (ref 5–15)
BUN: 9 mg/dL (ref 6–20)
CO2: 26 mmol/L (ref 22–32)
Calcium: 9 mg/dL (ref 8.9–10.3)
Chloride: 105 mmol/L (ref 98–111)
Creatinine, Ser: 0.58 mg/dL (ref 0.44–1.00)
GFR, Estimated: >60 mL/min (ref >60)
Glucose, Bld: 103 mg/dL — ABNORMAL HIGH (ref 70–99)
Potassium: 3.9 mmol/L (ref 3.5–5.1)
Sodium: 137 mmol/L (ref 135–145)

## 2023-01-22 LAB — HIV ANTIBODY (ROUTINE TESTING W REFLEX): HIV Screen 4th Generation wRfx: NONREACTIVE

## 2023-01-22 MED ORDER — DIPHENHYDRAMINE HCL 50 MG/ML IJ SOLN
12.5000 mg | Freq: Four times a day (QID) | INTRAMUSCULAR | Status: DC | PRN
  Administered 2023-01-22: 12.5 mg via INTRAVENOUS
  Filled 2023-01-22: qty 1

## 2023-01-22 NOTE — ED Notes (Signed)
ED TO INPATIENT HANDOFF REPORT  ED Nurse Name and Phone #: Waynette Buttery Name/Age/Gender Erika Lambert 57 y.o. female Room/Bed: WA24/WA24  Code Status   Code Status: Full Code  Home/SNF/Other Home Patient oriented to: self, place, time, and situation Is this baseline? Yes   Triage Complete: Triage complete  Chief Complaint SBO (small bowel obstruction) ([place]) [id number]  Triage Note Patient has centralized abdominal pain that moves around to the left side. Causing her shortness of breath and she said she feels like she is having a baby.    Allergies Allergies  Allergen Reactions   Grass Pollen(K-O-R-T-Swt Vern) Hives   Other Hives   Tomato Shortness Of Breath   Latex Other (See Comments)    welts   Pollen Extract Hives   Red Dye Nausea Only and Other (See Comments)    Unable to tolerate any foods with red dye.  Stomach pains   Whey Other (See Comments)    Severe bloating. Unable to tolerate any foods with whey   Codeine Other (See Comments)   Dust Mite Extract    Nutritional Supplements Other (See Comments)   Albuterol Hives   Penicillins Hives and Other (See Comments)    Dizziness Tolerated Cephalosporin Date: 04/23/20.     Level of Care/Admitting Diagnosis ED Disposition     ED Disposition  Admit   Condition  --   Comment  Hospital Area: [facility] [[id number]]  Level of Care: Med-Surg [16]  May admit patient to [facility] or [facility] if equivalent level of care is available:: No  Covid Evaluation: Asymptomatic - no recent exposure (last 10 days) testing not required  Diagnosis: SBO (small bowel obstruction) [id number]  Admitting Physician: [place], Warrick  Attending Physician: CCS, MD [id number]  Certification:: I certify this patient will need inpatient services for at least 2 midnights  Estimated Length of Stay: 2          B Medical/Surgery History Past Medical History:  Diagnosis Date   Anxiety     Arthritis    Asthma 2016   environmentla asthma    Cancer ([place]) 2009   thyroid   GERD (gastroesophageal reflux disease)    Headache    Hypothyroidism    Obesity    Pneumonia    hx of    Pre-diabetes    Sleep apnea    no longer uses cpap    Past Surgical History:  Procedure Laterality Date   BARIATRIC SURGERY     BREAST SURGERY Bilateral 2005   lift and skin removed   BUNIONECTOMY Left    [place]     for skin iremoval due to weight loss    GASTRIC ROUX-EN-Y N/A 01/26/2021   Procedure: LAPAROSCOPIC ROUX-EN-Y GASTRIC BYPASS WITH UPPER ENDOSCOPY;  Surgeon: Kieth Brightly [name], MD;  [facility];  Service: General;  Laterality: N/A;   TOTAL HIP ARTHROPLASTY Right 04/22/2020   Procedure: TOTAL HIP ARTHROPLASTY ANTERIOR APPROACH [place];  Surgeon: Gaynelle Arabian, MD;  [facility];  Service: Orthopedics;  Laterality: Right;  17mn   TOTAL THYROIDECTOMY  2009   UPPER GI ENDOSCOPY N/A 01/26/2021   Procedure: UPPER GI ENDOSCOPY;  Surgeon: KKieth Brightly LArta Bruce MD;  [facility];  Service: General;  Laterality: N/A;     A IV Location/Drains/Wounds Patient Lines/Drains/Airways Status     Active Line/Drains/Airways     Name Placement date Placement time  Site Days   Peripheral IV 04/29/21 22 G Posterior;Right Forearm 04/29/21  1400  Forearm  633   Peripheral IV 01/21/23 20 G Left Antecubital 01/21/23  2007  Antecubital  1   Incision (Closed) 04/22/20 Hip Right 04/22/20  0935  -- 1005   Incision - 6 Ports Abdomen 1: Left;Lateral;Umbilicus 2: Left;Lateral;Superior;Umbilicus 3: Left;Posterior;Lateral;Lower;Umbilicus 4: Right;Medial;Umbilicus 5: Right;Superior;Lateral;Umbilicus 6: Right;Lateral;Umbilicus 0000000  [id number]  -- 726            Intake/Output Last 24 hours No intake or output data in the 24 hours ending 01/22/23 0741  Labs/Imaging Results for orders placed or performed during the hospital encounter of  01/21/23 (from the past 48 hour(s))  Urinalysis, Routine w reflex microscopic -Urine, Clean Catch     Status: Abnormal   Collection Time: 01/21/23  8:08 PM  Result Value Ref Range   Color, Urine YELLOW YELLOW   APPearance CLEAR CLEAR   Specific Gravity, Urine 1.014 1.005 - 1.030   pH 8.0 5.0 - 8.0   Glucose, UA NEGATIVE NEGATIVE mg/dL   Hgb urine dipstick NEGATIVE NEGATIVE   Bilirubin Urine NEGATIVE NEGATIVE   Ketones, ur 5 (A) NEGATIVE mg/dL   Protein, ur NEGATIVE NEGATIVE mg/dL   Nitrite NEGATIVE NEGATIVE   Leukocytes,Ua SMALL (A) NEGATIVE   RBC / HPF 0-5 0 - 5 RBC/hpf   WBC, UA 6-10 0 - 5 WBC/hpf   Bacteria, UA NONE SEEN NONE SEEN   Squamous Epithelial / HPF 0-5 0 - 5 /HPF    Comment: Performed at [facility], [place] [address], [place], [place] [address]  CBC with Differential     Status: Abnormal   Collection Time: 01/21/23  8:18 PM  Result Value Ref Range   WBC 11.0 (H) 4.0 - 10.5 K/uL   RBC 4.42 3.87 - 5.11 MIL/uL   Hemoglobin 14.0 12.0 - 15.0 g/dL   HCT 42.3 36.0 - 46.0 %   MCV 95.7 80.0 - 100.0 fL   MCH 31.7 26.0 - 34.0 pg   MCHC 33.1 30.0 - 36.0 g/dL   RDW 12.7 11.5 - 15.5 %   Platelets 200 150 - 400 K/uL   nRBC 0.0 0.0 - 0.2 %   Neutrophils Relative % 79 %   Neutro Abs 8.7 (H) 1.7 - 7.7 K/uL   Lymphocytes Relative 15 %   Lymphs Abs 1.7 0.7 - 4.0 K/uL   Monocytes Relative 4 %   Monocytes Absolute 0.5 0.1 - 1.0 K/uL   Eosinophils Relative 1 %   Eosinophils Absolute 0.1 0.0 - 0.5 K/uL   Basophils Relative 1 %   Basophils Absolute 0.1 0.0 - 0.1 K/uL   Immature Granulocytes 0 %   Abs Immature Granulocytes 0.03 0.00 - 0.07 K/uL    Comment: Performed at [facility], [place] [address] [place], [place] [address]  Comprehensive metabolic panel     Status: Abnormal   Collection Time: 01/21/23  8:18 PM  Result Value Ref Range   Sodium 137 135 - 145 mmol/L   Potassium 3.7 3.5 - 5.1 mmol/L   Chloride 98 98 - 111 mmol/L   CO2 28 22 -  32 mmol/L   Glucose, Bld 139 (H) 70 - 99 mg/dL    Comment: Glucose reference range applies only to samples taken after fasting for at least 8 hours.   BUN 12 6 - 20 mg/dL   Creatinine, Ser 0.75 0.44 - 1.00 mg/dL   Calcium 9.4 8.9 - 10.3 mg/dL  Total Protein 7.7 6.5 - 8.1 g/dL   Albumin 4.4 3.5 - 5.0 g/dL   AST 29 15 - 41 U/L   ALT 11 0 - 44 U/L   Alkaline Phosphatase 89 38 - 126 U/L   Total Bilirubin 0.6 0.3 - 1.2 mg/dL   GFR, Estimated >60 >60 mL/min    Comment: (NOTE) Calculated using the CKD-EPI Creatinine Equation (2021)    Anion gap 11 5 - 15    Comment: Performed at [facility], [place] [address] [place], [place] [address]  Lipase, blood     Status: None   Collection Time: 01/21/23  8:18 PM  Result Value Ref Range   Lipase 39 11 - 51 U/L    Comment: Performed at [facility], [place] [address], [place], [place] [address]  HIV Antibody (routine testing w rflx)     Status: None   Collection Time: 01/21/23 11:28 PM  Result Value Ref Range   HIV Screen 4th Generation wRfx Non Reactive Non Reactive    Comment: Performed at [facility], [place] [address], [place], [place] [id number]  Basic metabolic panel     Status: Abnormal   Collection Time: 01/22/23  6:27 AM  Result Value Ref Range   Sodium 137 135 - 145 mmol/L   Potassium 3.9 3.5 - 5.1 mmol/L   Chloride 105 98 - 111 mmol/L   CO2 26 22 - 32 mmol/L   Glucose, Bld 103 (H) 70 - 99 mg/dL    Comment: Glucose reference range applies only to samples taken after fasting for at least 8 hours.   BUN 9 6 - 20 mg/dL   Creatinine, Ser 0.58 0.44 - 1.00 mg/dL   Calcium 9.0 8.9 - 10.3 mg/dL   GFR, Estimated >60 >60 mL/min    Comment: (NOTE) Calculated using the CKD-EPI Creatinine Equation (2021)    Anion gap 6 5 - 15    Comment: Performed at [facility], [place] [address] [place], [place] [address]  CBC     Status: None   Collection Time: 01/22/23  6:27 AM  Result Value Ref  Range   WBC 7.2 4.0 - 10.5 K/uL   RBC 3.87 3.87 - 5.11 MIL/uL   Hemoglobin 12.1 12.0 - 15.0 g/dL   HCT 37.6 36.0 - 46.0 %   MCV 97.2 80.0 - 100.0 fL   MCH 31.3 26.0 - 34.0 pg   MCHC 32.2 30.0 - 36.0 g/dL   RDW 12.9 11.5 - 15.5 %   Platelets 171 150 - 400 K/uL   nRBC 0.0 0.0 - 0.2 %    Comment: Performed at [facility], [place] [address] [place], [place] [address]   CT ABDOMEN PELVIS W CONTRAST  Result Date: 01/21/2023 CLINICAL DATA:  57 year old female with abdominal and pelvic pain. History of gastric bypass/Roux-en-Y. EXAM: CT ABDOMEN AND PELVIS WITH CONTRAST TECHNIQUE: Multidetector CT imaging of the abdomen and pelvis was performed using the standard protocol following bolus administration of intravenous contrast. RADIATION DOSE REDUCTION: This exam was performed according to the departmental dose-optimization program which includes automated exposure control, adjustment of the mA and/or kV according to patient size and/or use of iterative reconstruction technique. CONTRAST:  122m OMNIPAQUE IOHEXOL 300 MG/ML  SOLN COMPARISON:  None Available. FINDINGS: Lower chest: No acute abnormality. Hepatobiliary: The liver and gallbladder are unremarkable. There is no evidence of intrahepatic or extrahepatic biliary dilatation. Pancreas: Unremarkable Spleen: Unremarkable Adrenals/Urinary Tract: The kidneys, adrenal glands  and bladder are unremarkable. Stomach/Bowel: Gastric bypass class/Roux-en-Y changes noted. The bypassed stomach, afferent loop and what appears to be the efferent loop are unremarkable. A distended loop of small bowel extending from the anterior abdomen into the LEFT anterior pelvis with areas an apparent transition point. No obvious cause identified and this area of transition is located INFERIOR to the enteric anastomotic sutures. No evidence of bowel wall thickening or inflammatory changes noted. Stool and gas within the colon and rectum noted. There is no evidence of  pneumoperitoneum. Vascular/Lymphatic: No significant vascular findings are present. No enlarged abdominal or pelvic lymph nodes. Reproductive: Uterus and bilateral adnexa are unremarkable except for evidence of tubal ligation. Other: No ascites or focal collection/abscess. Musculoskeletal: No acute or suspicious bony abnormalities are noted. RIGHT hip arthroplasty changes noted. IMPRESSION: 1. Distended loop of small bowel extending from the anterior abdomen into the LEFT anterior pelvis to apparent transition point. No obvious cause identified and this area of transition is located INFERIOR to the enteric anastomotic sutures. This is suspicious for a partial or early small bowel obstruction. 2. 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 No evidence of bowel wall thickening or inflammatory changes. Electronically Signed   By: Margarette Canada M.D.   On: 01/21/2023 21:30   DG Chest Port 1 View  Result Date: 01/21/2023 CLINICAL DATA:  Shortness of breath EXAM: PORTABLE CHEST 1 VIEW COMPARISON:  None Available. FINDINGS: The heart size and mediastinal contours are within normal limits. Both lungs are clear. The visualized skeletal structures are unremarkable. IMPRESSION: No active disease. Electronically Signed   By: Placido Sou M.D.   On: 01/21/2023 20:25    Pending Labs Unresulted Labs (From admission, onward)    None       Vitals/Pain Today's Vitals   01/22/23 0134 01/22/23 0145 01/22/23 0520 01/22/23 0520  BP:   109/63   Pulse:   60   Resp:   19   Temp:    98 F (36.7 C)  TempSrc:    Oral  SpO2:   99%   Weight:      Height:      PainSc: Asleep Asleep      Isolation Precautions No active isolations  Medications Medications  enoxaparin (LOVENOX) injection 40 mg (40 mg Subcutaneous Given 01/21/23 2326)  0.9 %  sodium chloride infusion (0 mLs Intravenous Stopped 01/22/23 0656)   HYDROmorphone (DILAUDID) injection 1 mg (1 mg Intravenous Given 01/21/23 2316)  ondansetron (ZOFRAN-ODT) disintegrating tablet 4 mg (has no administration in time range)    Or  ondansetron (ZOFRAN) injection 4 mg (has no administration in time range)  diphenhydrAMINE (BENADRYL) injection 12.5 mg (12.5 mg Intravenous Given 01/22/23 0517)  LORazepam (ATIVAN) injection 0.5 mg (0.5 mg Intravenous Given 01/21/23 2006)  morphine (PF) 4 MG/ML injection 4 mg (4 mg Intravenous Given 01/21/23 2006)  sodium chloride 0.9 % bolus 500 mL (0 mLs Intravenous Stopped 01/22/23 0308)  iohexol (OMNIPAQUE) 300 MG/ML solution 100 mL (100 mLs Intravenous Contrast Given 01/21/23 2053)    Mobility walks     Focused Assessments Cardiac Assessment Handoff:    No results found for: "CKTOTAL", "CKMB", "CKMBINDEX", "TROPONINI" No results found for: "DDIMER" Does the Patient currently have chest pain? No    R Recommendations: See Admitting Provider Note  Report given to:   Additional Notes:

## 2023-01-22 NOTE — Discharge Summary (Signed)
Physician Discharge Summary  Patient ID: Erika Lambert MRN: [id number] DOB/AGE: March 06, 1966 57 y.o.  Admit date: 01/21/2023 Discharge date: 01/22/2023  Admission Diagnoses:  Discharge Diagnoses:  Principal Problem:   SBO (small bowel obstruction) [facility])   Discharged Condition: good  Hospital Course: The patient was admitted for a small bowel obstruction.  Her symptoms seem to resolve fairly quickly.  She was started on a diet which she tolerated and then went home.  Consults: None  Significant Diagnostic Studies: CT  Treatments: none  Discharge Exam: Blood pressure (!) 135/97, pulse 65, temperature 98.1 F (36.7 C), temperature source Oral, resp. rate 20, height '5\' 5"'$  (1.651 m), weight 82 kg, SpO2 100 %. GI: soft, nontender  Disposition: Discharge disposition: 01-Home or Self Care       Discharge Instructions     Call MD for:  difficulty breathing, headache or visual disturbances   Complete by: As directed    Call MD for:  extreme fatigue   Complete by: As directed    Call MD for:  hives   Complete by: As directed    Call MD for:  persistant dizziness or light-headedness   Complete by: As directed    Call MD for:  persistant nausea and vomiting   Complete by: As directed    Call MD for:  severe uncontrolled pain   Complete by: As directed    Call MD for:  temperature >100.4   Complete by: As directed    Diet - low sodium heart healthy   Complete by: As directed    Discharge instructions   Complete by: As directed    May return to normal activities. Follow up with medical doctor in next couple weeks   Increase activity slowly   Complete by: As directed    No wound care   Complete by: As directed       Allergies as of 01/22/2023       Reactions   Grass Pollen(k-o-r-t-swt Vern) Hives   Other Hives   Tomato Shortness Of Breath   Latex Other (See Comments)   welts   Pollen Extract Hives   Red Dye Nausea Only, Other (See Comments)   Unable to  tolerate any foods with red dye.  Stomach pains   Whey Other (See Comments)   Severe bloating. Unable to tolerate any foods with whey   Codeine Other (See Comments)   Dust Mite Extract    Nutritional Supplements Other (See Comments)   Albuterol Hives   Penicillins Hives, Other (See Comments)   Dizziness Tolerated Cephalosporin Date: 04/23/20.        Medication List     TAKE these medications    acetaminophen 500 MG tablet Commonly known as: TYLENOL Take 500 mg by mouth every 6 (six) hours as needed (pain).   buPROPion 150 MG 24 hr tablet Commonly known as: WELLBUTRIN XL Take 150 mg by mouth daily.   diphenhydrAMINE 25 mg capsule Commonly known as: BENADRYL Take 25 mg by mouth at bedtime as needed for allergies.   levocetirizine 5 MG tablet Commonly known as: XYZAL Take 5 mg by mouth at bedtime as needed for allergies.   levothyroxine 88 MCG tablet Commonly known as: SYNTHROID Take 88 mcg by mouth daily before breakfast.   multivitamin with minerals Tabs tablet Take 1 tablet by mouth daily at 6 PM. Centrum Silver for Women   omeprazole 20 MG capsule Commonly known as: PriLOSEC Take 1 capsule (20 mg total) by mouth 2 (two)  times daily. What changed:  when to take this reasons to take this   Vuity 1.25 % Soln Generic drug: Pilocarpine HCl Apply 1 drop to eye daily.         Signed: Autumn Messing III 01/22/2023, 7:53 PM

## 2023-01-22 NOTE — TOC Progression Note (Addendum)
Transition of Care [facility]) - Progression Note    Patient Details  Name: Erika Lambert MRN: [id number] Date of Birth: 10-02-1966  [facility]) CM/SW Contact  Servando Snare,  Phone Number: 01/22/2023, 11:21 AM  Clinical Narrative:      Transition of Care (TOC) Screening Note   Patient Details  Name: Erika Lambert Date of Birth: [birth year]   Transition of Care [facility]) CM/SW Contact:    Servando Snare, LCSW Phone Number: 01/22/2023, 11:21 AM    Transition of Care Department [facility]) has reviewed patient and no TOC needs have been identified at this time. We will continue to monitor patient advancement through interdisciplinary progression rounds. If new patient transition needs arise, please place a TOC consult.         Expected Discharge Plan and Services                                               Social Determinants of Health (SDOH) Interventions SDOH Screenings   Food Insecurity: No Food Insecurity (01/22/2023)  Housing: Low Risk  (01/22/2023)  Transportation Needs: No Transportation Needs (01/22/2023)  Utilities: Not At Risk (01/22/2023)  Depression (PHQ2-9): Low Risk  (10/08/2020)  Tobacco Use: Low Risk  (01/21/2023)    Readmission Risk Interventions     No data to display

## 2023-01-22 NOTE — Progress Notes (Signed)
Subjective/Chief Complaint: Feels better. Passed small amount of flatus this am. Pain seems to be going away   Objective: Vital signs in last 24 hours: Temp:  [98 F (36.7 C)-98.4 F (36.9 C)] 98 F (36.7 C) (02/25 0520) Pulse Rate:  [60-73] 70 (02/25 0730) Resp:  [19-22] 22 (02/25 0730) BP: (109-122)/(63-91) 122/81 (02/25 0730) SpO2:  [93 %-100 %] 93 % (02/25 0730) Weight:  [82 kg] 82 kg (02/24 1917)    Intake/Output from previous day: No intake/output data recorded. Intake/Output this shift: No intake/output data recorded.  General appearance: alert and cooperative Resp: clear to auscultation bilaterally Cardio: regular rate and rhythm GI: soft, minimal left sided tenderness. No distension  Lab Results:  Recent Labs    01/21/23 2018 01/22/23 0627  WBC 11.0* 7.2  HGB 14.0 12.1  HCT 42.3 37.6  PLT 200 171   BMET Recent Labs    01/21/23 2018 01/22/23 0627  NA 137 137  K 3.7 3.9  CL 98 105  CO2 28 26  GLUCOSE 139* 103*  BUN 12 9  CREATININE 0.75 0.58  CALCIUM 9.4 9.0   PT/INR No results for input(s): "LABPROT", "INR" in the last 72 hours. ABG No results for input(s): "PHART", "HCO3" in the last 72 hours.  Invalid input(s): "PCO2", "PO2"  Studies/Results: CT ABDOMEN PELVIS W CONTRAST  Result Date: 01/21/2023 CLINICAL DATA:  57 year old female with abdominal and pelvic pain. History of gastric bypass/Roux-en-Y. EXAM: CT ABDOMEN AND PELVIS WITH CONTRAST TECHNIQUE: Multidetector CT imaging of the abdomen and pelvis was performed using the standard protocol following bolus administration of intravenous contrast. RADIATION DOSE REDUCTION: This exam was performed according to the departmental dose-optimization program which includes automated exposure control, adjustment of the mA and/or kV according to patient size and/or use of iterative reconstruction technique. CONTRAST:  142m OMNIPAQUE IOHEXOL 300 MG/ML  SOLN COMPARISON:  None Available. FINDINGS: Lower  chest: No acute abnormality. Hepatobiliary: The liver and gallbladder are unremarkable. There is no evidence of intrahepatic or extrahepatic biliary dilatation. Pancreas: Unremarkable Spleen: Unremarkable Adrenals/Urinary Tract: The kidneys, adrenal glands and bladder are unremarkable. Stomach/Bowel: Gastric bypass class/Roux-en-Y changes noted. The bypassed stomach, afferent loop and what appears to be the efferent loop are unremarkable. A distended loop of small bowel extending from the anterior abdomen into the LEFT anterior pelvis with areas an apparent transition point. No obvious cause identified and this area of transition is located INFERIOR to the enteric anastomotic sutures. No evidence of bowel wall thickening or inflammatory changes noted. Stool and gas within the colon and rectum noted. There is no evidence of pneumoperitoneum. Vascular/Lymphatic: No significant vascular findings are present. No enlarged abdominal or pelvic lymph nodes. Reproductive: Uterus and bilateral adnexa are unremarkable except for evidence of tubal ligation. Other: No ascites or focal collection/abscess. Musculoskeletal: No acute or suspicious bony abnormalities are noted. RIGHT hip arthroplasty changes noted. IMPRESSION: 1. Distended loop of small bowel extending from the anterior abdomen into the LEFT anterior pelvis to apparent transition point. No obvious cause identified and this area of transition is located INFERIOR to the enteric anastomotic sutures. This is suspicious for a partial or early small bowel obstruction. 2. 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 No evidence of bowel wall thickening or inflammatory changes. Electronically Signed   By: JMargarette CanadaM.D.   On: 01/21/2023 21:30   DG Chest Port 1 View  Result Date: 01/21/2023 CLINICAL DATA:  Shortness  of breath EXAM: PORTABLE CHEST 1 VIEW COMPARISON:  None  Available. FINDINGS: The heart size and mediastinal contours are within normal limits. Both lungs are clear. The visualized skeletal structures are unremarkable. IMPRESSION: No active disease. Electronically Signed   By: Placido Sou M.D.   On: 01/21/2023 20:25    Anti-infectives: Anti-infectives (From admission, onward)    None       Assessment/Plan: s/p * No surgery found * Sbo seems to be improving. Will recheck abd xrays Bowel rest for now with IV hydration Ambulate Will discuss CT with our bariatric surgeons  LOS: 1 day    Autumn Messing III 01/22/2023

## 2023-01-22 NOTE — Plan of Care (Signed)

## 2023-01-22 NOTE — Plan of Care (Signed)
  Problem: Education: Goal: Knowledge of General Education information will improve Description Including pain rating scale, medication(s)/side effects and non-pharmacologic comfort measures Outcome: Progressing   

## 2023-02-14 DIAGNOSIS — B029 Zoster without complications: Secondary | ICD-10-CM | POA: Diagnosis not present

## 2023-04-07 DIAGNOSIS — Z1231 Encounter for screening mammogram for malignant neoplasm of breast: Secondary | ICD-10-CM | POA: Diagnosis not present

## 2023-05-15 DIAGNOSIS — M62838 Other muscle spasm: Secondary | ICD-10-CM | POA: Diagnosis not present

## 2023-05-24 DIAGNOSIS — M542 Cervicalgia: Secondary | ICD-10-CM | POA: Diagnosis not present

## 2023-06-28 DIAGNOSIS — E7800 Pure hypercholesterolemia, unspecified: Secondary | ICD-10-CM | POA: Diagnosis not present

## 2023-06-28 DIAGNOSIS — R7301 Impaired fasting glucose: Secondary | ICD-10-CM | POA: Diagnosis not present

## 2023-06-28 DIAGNOSIS — R682 Dry mouth, unspecified: Secondary | ICD-10-CM | POA: Diagnosis not present

## 2023-06-28 DIAGNOSIS — R7303 Prediabetes: Secondary | ICD-10-CM | POA: Diagnosis not present

## 2023-06-28 DIAGNOSIS — Z9884 Bariatric surgery status: Secondary | ICD-10-CM | POA: Diagnosis not present

## 2023-06-28 DIAGNOSIS — Z0000 Encounter for general adult medical examination without abnormal findings: Secondary | ICD-10-CM | POA: Diagnosis not present

## 2023-10-24 DIAGNOSIS — K080 Exfoliation of teeth due to systemic causes: Secondary | ICD-10-CM | POA: Diagnosis not present

## 2023-12-18 DIAGNOSIS — Z96641 Presence of right artificial hip joint: Secondary | ICD-10-CM | POA: Diagnosis not present

## 2023-12-25 DIAGNOSIS — E282 Polycystic ovarian syndrome: Secondary | ICD-10-CM | POA: Diagnosis not present

## 2023-12-25 DIAGNOSIS — E890 Postprocedural hypothyroidism: Secondary | ICD-10-CM | POA: Diagnosis not present

## 2023-12-25 DIAGNOSIS — Z85850 Personal history of malignant neoplasm of thyroid: Secondary | ICD-10-CM | POA: Diagnosis not present

## 2024-01-03 DIAGNOSIS — M25551 Pain in right hip: Secondary | ICD-10-CM | POA: Diagnosis not present

## 2024-01-15 DIAGNOSIS — M25551 Pain in right hip: Secondary | ICD-10-CM | POA: Diagnosis not present

## 2024-01-29 DIAGNOSIS — L739 Follicular disorder, unspecified: Secondary | ICD-10-CM | POA: Diagnosis not present

## 2024-01-29 DIAGNOSIS — L680 Hirsutism: Secondary | ICD-10-CM | POA: Diagnosis not present

## 2024-04-29 DIAGNOSIS — Z1231 Encounter for screening mammogram for malignant neoplasm of breast: Secondary | ICD-10-CM | POA: Diagnosis not present

## 2024-06-10 DIAGNOSIS — F4323 Adjustment disorder with mixed anxiety and depressed mood: Secondary | ICD-10-CM | POA: Diagnosis not present

## 2024-06-17 DIAGNOSIS — F4323 Adjustment disorder with mixed anxiety and depressed mood: Secondary | ICD-10-CM | POA: Diagnosis not present

## 2024-06-26 DIAGNOSIS — E282 Polycystic ovarian syndrome: Secondary | ICD-10-CM | POA: Diagnosis not present

## 2024-06-26 DIAGNOSIS — E890 Postprocedural hypothyroidism: Secondary | ICD-10-CM | POA: Diagnosis not present

## 2024-06-26 DIAGNOSIS — Z85850 Personal history of malignant neoplasm of thyroid: Secondary | ICD-10-CM | POA: Diagnosis not present

## 2024-07-01 DIAGNOSIS — F4323 Adjustment disorder with mixed anxiety and depressed mood: Secondary | ICD-10-CM | POA: Diagnosis not present

## 2024-07-01 DIAGNOSIS — Z0000 Encounter for general adult medical examination without abnormal findings: Secondary | ICD-10-CM | POA: Diagnosis not present

## 2024-07-03 DIAGNOSIS — E7800 Pure hypercholesterolemia, unspecified: Secondary | ICD-10-CM | POA: Diagnosis not present

## 2024-07-03 DIAGNOSIS — R7303 Prediabetes: Secondary | ICD-10-CM | POA: Diagnosis not present

## 2024-07-03 DIAGNOSIS — Z9884 Bariatric surgery status: Secondary | ICD-10-CM | POA: Diagnosis not present

## 2024-07-22 DIAGNOSIS — R0981 Nasal congestion: Secondary | ICD-10-CM | POA: Diagnosis not present

## 2024-07-22 DIAGNOSIS — Z20822 Contact with and (suspected) exposure to covid-19: Secondary | ICD-10-CM | POA: Diagnosis not present

## 2024-07-22 DIAGNOSIS — R519 Headache, unspecified: Secondary | ICD-10-CM | POA: Diagnosis not present

## 2024-08-13 DIAGNOSIS — E890 Postprocedural hypothyroidism: Secondary | ICD-10-CM | POA: Diagnosis not present

## 2024-08-15 DIAGNOSIS — R252 Cramp and spasm: Secondary | ICD-10-CM | POA: Diagnosis not present

## 2024-08-21 ENCOUNTER — Encounter (HOSPITAL_COMMUNITY): Payer: Self-pay | Admitting: *Deleted

## 2024-10-29 DIAGNOSIS — Z85850 Personal history of malignant neoplasm of thyroid: Secondary | ICD-10-CM | POA: Diagnosis not present

## 2024-10-29 DIAGNOSIS — E282 Polycystic ovarian syndrome: Secondary | ICD-10-CM | POA: Diagnosis not present

## 2024-10-29 DIAGNOSIS — E890 Postprocedural hypothyroidism: Secondary | ICD-10-CM | POA: Diagnosis not present

## 2024-10-29 DIAGNOSIS — R7303 Prediabetes: Secondary | ICD-10-CM | POA: Diagnosis not present

## 2024-11-05 DIAGNOSIS — Z9884 Bariatric surgery status: Secondary | ICD-10-CM | POA: Diagnosis not present
